# Patient Record
Sex: Female | Born: 1950 | Race: White | Hispanic: No | Marital: Single | Smoking: Never smoker
Health system: Southern US, Community
[De-identification: ages and names within clinical notes are randomized; demographics above are authoritative.]

## PROBLEM LIST (undated history)

## (undated) DIAGNOSIS — E039 Hypothyroidism, unspecified: Secondary | ICD-10-CM

## (undated) DIAGNOSIS — J45909 Unspecified asthma, uncomplicated: Secondary | ICD-10-CM

## (undated) DIAGNOSIS — I10 Essential (primary) hypertension: Secondary | ICD-10-CM

## (undated) DIAGNOSIS — J309 Allergic rhinitis, unspecified: Secondary | ICD-10-CM

## (undated) HISTORY — PX: SINOSCOPY: SHX187

## (undated) HISTORY — DX: Essential (primary) hypertension: I10

## (undated) HISTORY — PX: ADENOIDECTOMY: SUR15

## (undated) HISTORY — PX: CHOLECYSTECTOMY: SHX55

## (undated) HISTORY — DX: Hypothyroidism, unspecified: E03.9

## (undated) HISTORY — PX: TONSILLECTOMY: SUR1361

## (undated) HISTORY — PX: BASAL CELL CARCINOMA EXCISION: SHX1214

## (undated) HISTORY — DX: Allergic rhinitis, unspecified: J30.9

## (undated) HISTORY — DX: Unspecified asthma, uncomplicated: J45.909

---

## 2010-06-04 ENCOUNTER — Ambulatory Visit (HOSPITAL_COMMUNITY)
Admission: RE | Admit: 2010-06-04 | Discharge: 2010-06-04 | Payer: Self-pay | Source: Home / Self Care | Attending: Sports Medicine | Admitting: Sports Medicine

## 2010-09-05 ENCOUNTER — Other Ambulatory Visit: Payer: Self-pay | Admitting: Orthopedic Surgery

## 2010-09-05 DIAGNOSIS — M25572 Pain in left ankle and joints of left foot: Secondary | ICD-10-CM

## 2010-09-06 ENCOUNTER — Ambulatory Visit
Admission: RE | Admit: 2010-09-06 | Discharge: 2010-09-06 | Disposition: A | Discharge status: Home / Self Care | Payer: Self-pay | Source: Ambulatory Visit | Attending: Orthopedic Surgery | Admitting: Orthopedic Surgery

## 2010-09-06 DIAGNOSIS — M25572 Pain in left ankle and joints of left foot: Secondary | ICD-10-CM

## 2012-10-15 DIAGNOSIS — I152 Hypertension secondary to endocrine disorders: Secondary | ICD-10-CM | POA: Insufficient documentation

## 2012-10-15 DIAGNOSIS — E039 Hypothyroidism, unspecified: Secondary | ICD-10-CM | POA: Insufficient documentation

## 2012-10-15 DIAGNOSIS — E1159 Type 2 diabetes mellitus with other circulatory complications: Secondary | ICD-10-CM | POA: Insufficient documentation

## 2013-07-14 DIAGNOSIS — F411 Generalized anxiety disorder: Secondary | ICD-10-CM | POA: Insufficient documentation

## 2013-07-14 DIAGNOSIS — G43909 Migraine, unspecified, not intractable, without status migrainosus: Secondary | ICD-10-CM | POA: Insufficient documentation

## 2013-07-14 DIAGNOSIS — F329 Major depressive disorder, single episode, unspecified: Secondary | ICD-10-CM | POA: Insufficient documentation

## 2013-07-14 DIAGNOSIS — F32A Depression, unspecified: Secondary | ICD-10-CM | POA: Insufficient documentation

## 2013-07-14 DIAGNOSIS — J4520 Mild intermittent asthma, uncomplicated: Secondary | ICD-10-CM | POA: Insufficient documentation

## 2013-07-14 DIAGNOSIS — G250 Essential tremor: Secondary | ICD-10-CM | POA: Insufficient documentation

## 2013-07-14 DIAGNOSIS — R519 Headache, unspecified: Secondary | ICD-10-CM | POA: Insufficient documentation

## 2013-07-14 DIAGNOSIS — R51 Headache: Secondary | ICD-10-CM

## 2013-09-26 DIAGNOSIS — R5382 Chronic fatigue, unspecified: Secondary | ICD-10-CM

## 2013-09-26 DIAGNOSIS — R5381 Other malaise: Secondary | ICD-10-CM | POA: Insufficient documentation

## 2014-02-19 DIAGNOSIS — E559 Vitamin D deficiency, unspecified: Secondary | ICD-10-CM | POA: Insufficient documentation

## 2014-04-24 DIAGNOSIS — M5432 Sciatica, left side: Secondary | ICD-10-CM | POA: Insufficient documentation

## 2014-06-08 DIAGNOSIS — M5136 Other intervertebral disc degeneration, lumbar region: Secondary | ICD-10-CM | POA: Insufficient documentation

## 2014-06-08 DIAGNOSIS — M4310 Spondylolisthesis, site unspecified: Secondary | ICD-10-CM | POA: Insufficient documentation

## 2014-09-26 DIAGNOSIS — K2960 Other gastritis without bleeding: Secondary | ICD-10-CM | POA: Insufficient documentation

## 2015-06-27 DIAGNOSIS — J3089 Other allergic rhinitis: Secondary | ICD-10-CM | POA: Insufficient documentation

## 2016-03-12 DIAGNOSIS — K760 Fatty (change of) liver, not elsewhere classified: Secondary | ICD-10-CM | POA: Insufficient documentation

## 2016-03-12 DIAGNOSIS — K219 Gastro-esophageal reflux disease without esophagitis: Secondary | ICD-10-CM | POA: Insufficient documentation

## 2016-03-12 DIAGNOSIS — Z1211 Encounter for screening for malignant neoplasm of colon: Secondary | ICD-10-CM | POA: Insufficient documentation

## 2016-03-12 DIAGNOSIS — R945 Abnormal results of liver function studies: Secondary | ICD-10-CM | POA: Insufficient documentation

## 2016-03-12 DIAGNOSIS — R110 Nausea: Secondary | ICD-10-CM | POA: Insufficient documentation

## 2016-03-12 DIAGNOSIS — R7989 Other specified abnormal findings of blood chemistry: Secondary | ICD-10-CM | POA: Insufficient documentation

## 2016-10-26 DIAGNOSIS — R7301 Impaired fasting glucose: Secondary | ICD-10-CM | POA: Insufficient documentation

## 2016-10-26 DIAGNOSIS — E119 Type 2 diabetes mellitus without complications: Secondary | ICD-10-CM | POA: Insufficient documentation

## 2016-11-19 ENCOUNTER — Ambulatory Visit (INDEPENDENT_AMBULATORY_CARE_PROVIDER_SITE_OTHER): Payer: Medicare HMO | Admitting: Pediatrics

## 2016-11-19 ENCOUNTER — Encounter: Payer: Self-pay | Admitting: Pediatrics

## 2016-11-19 VITALS — BP 110/76 | HR 82 | Temp 98.2°F | Resp 16 | Ht 64.57 in | Wt 177.0 lb

## 2016-11-19 DIAGNOSIS — K219 Gastro-esophageal reflux disease without esophagitis: Secondary | ICD-10-CM | POA: Diagnosis not present

## 2016-11-19 DIAGNOSIS — J0100 Acute maxillary sinusitis, unspecified: Secondary | ICD-10-CM | POA: Diagnosis not present

## 2016-11-19 DIAGNOSIS — I10 Essential (primary) hypertension: Secondary | ICD-10-CM

## 2016-11-19 DIAGNOSIS — J4540 Moderate persistent asthma, uncomplicated: Secondary | ICD-10-CM | POA: Insufficient documentation

## 2016-11-19 DIAGNOSIS — J4530 Mild persistent asthma, uncomplicated: Secondary | ICD-10-CM

## 2016-11-19 DIAGNOSIS — J3089 Other allergic rhinitis: Secondary | ICD-10-CM | POA: Diagnosis not present

## 2016-11-19 DIAGNOSIS — J019 Acute sinusitis, unspecified: Secondary | ICD-10-CM | POA: Insufficient documentation

## 2016-11-19 MED ORDER — ALBUTEROL SULFATE HFA 108 (90 BASE) MCG/ACT IN AERS: 2.0000 | INHALATION_SPRAY | RESPIRATORY_TRACT | 3 refills | Status: DC | PRN

## 2016-11-19 MED ORDER — BUDESONIDE-FORMOTEROL FUMARATE 160-4.5 MCG/ACT IN AERO
INHALATION_SPRAY | RESPIRATORY_TRACT | 5 refills | Status: DC
Start: 2016-11-19 — End: 2017-02-23

## 2016-11-19 MED ORDER — FLUTICASONE PROPIONATE 50 MCG/ACT NA SUSP: NASAL | 5 refills | Status: DC

## 2016-11-19 MED ORDER — AMOXICILLIN-POT CLAVULANATE 875-125 MG PO TABS: ORAL_TABLET | ORAL | 0 refills | Status: DC

## 2016-11-19 NOTE — Patient Instructions (Signed)
Environmental control of dust and mold Zyrtec 10 mg once a day for runny nose Nasal saline irrigations at night followed by fluticasone 2 sprays per nostril at night Symbicort 160-2 puffs every 12 hours for cough or wheeze Pro-air 2 puffs every 4 hours if needed for wheezing or coughing spells. Should be less expensive than Xopenex Augmentin 875 mg every 12 hours for 10 days for infection Add prednisone 10 mg twice a day for 4 days, 10 mg on the fifth day Call me if you're not doing better on this treatment plan Continue on your other medications

## 2016-11-19 NOTE — Progress Notes (Signed)
[address] [place] [address] Dept: [phone]  New Patient Note  Patient ID: Annette Wade, female    DOB: [birth year]  Age: 66 y.o. MRN: [id number] Date of Office Visit: 11/19/2016 Referring provider: No referring provider defined for this encounter.    Chief Complaint: Cough (sx began October 24, 2016); Wheezing; and Nasal Congestion  HPI [place] Klemann presents for evaluation of coughing and wheezing since June 16. She went to an urgent care center and a chest x-ray was normal. She was given Zithromax. She has continued to have nasal congestion and a discolored postnasal drainage. She is bringing up a greenish mucus from her chest.. She has run out of Qvar. High doses of prednisone give her mood swings. She has had asthma for over 20 years. She has been using Xopenex but she has tolerated albuterol in the past. She has never had eczema or chronic hives. She has had one episode of pneumonia in the past She has had nasal congestion aggravated by exposure to dust, cigarette smoke and cats. She has gastroesophageal reflux and uses ranitidine 300 mg if needed.  Review of Systems  Constitutional: Negative.   HENT:       Nasal congestion and postnasal drainage for several years. History of sinus surgery-no polyps noted. History of tonsillectomy  Eyes:       One small cataract  Respiratory:       Asthma for over 20 years. History of 1 pneumonia  Cardiovascular:       Hypertension  Gastrointestinal:       Heartburn at times. History of cholecystectomy  Genitourinary: Negative.   Musculoskeletal: Negative.   Skin: Negative.   Neurological: Negative.   Endo/Heme/Allergies:       Hypothyroidism. No diabetes  Psychiatric/Behavioral:       Depression    Outpatient Encounter Prescriptions as of 11/19/2016  Medication Sig  . beclomethasone (QVAR) 80 MCG/ACT inhaler 2 puffs daily.  . Calcium Carbonate-Vit D-Min (CALCIUM 1200 PO) Take by mouth.  . clonazePAM (KLONOPIN) 1 MG  tablet Take 1 mg by mouth.  . DULoxetine (CYMBALTA) 60 MG capsule Take 60 mg by mouth.  . fluticasone (FLONASE) 50 MCG/ACT nasal spray Two sprays each nostril at night for nasal congestion or drainage.  . levalbuterol (XOPENEX HFA) 45 MCG/ACT inhaler Inhale into the lungs.  Marland Kitchen levothyroxine (SYNTHROID, LEVOTHROID) 75 MCG tablet Take one tablet by mouth daily on an empty stomach  . ranitidine (ZANTAC) 300 MG tablet   . traZODone (DESYREL) 50 MG tablet Take 100 mg by mouth.  . valsartan (DIOVAN) 160 MG tablet Take 160 mg by mouth.  . [DISCONTINUED] fluticasone (FLONASE) 50 MCG/ACT nasal spray 1 spray by Each Nare route daily.  Marland Kitchen albuterol (PROAIR HFA) 108 (90 Base) MCG/ACT inhaler Inhale 2 puffs into the lungs every 4 (four) hours as needed for wheezing or shortness of breath.  Marland Kitchen amoxicillin-clavulanate (AUGMENTIN) 875-125 MG tablet One tablet every 12 hours for 10 days for infection.  . budesonide-formoterol (SYMBICORT) 160-4.5 MCG/ACT inhaler Two puffs every 12 hours to prevent cough or wheeze. Rinse, gargle and spit after use.   No facility-administered encounter medications on file as of 11/19/2016.      Drug Allergies:  Allergies  Allergen Reactions  . Lisinopril Nausea Only and Other (See Comments)    Typical ACE Inhibitor Cough cough  . Moxifloxacin Nausea Only and Hives    Other reaction(s): HIVES  . Codeine Nausea And Vomiting  . Oxycodone-Acetaminophen Itching  . Prednisone Anxiety  Family History: Juda's family history includes Asthma in her daughter..Sinus problems in her daughter. Family history is negative for hay fever, eczema, hives, food allergies, chronic bronchitis or emphysema.  Social and environmental. There is a dog in the home. She is not exposed to cigarette smoking. She has never smoked cigarettes in the past. She is retired and unemployed  Physical Exam: BP 110/76 (BP Location: Left Arm, Patient Position: Sitting, Cuff Size: Normal)   Pulse 82   Temp  98.2 F (36.8 C) (Oral)   Resp 16   Ht 5' 4.57" (1.64 m)   Wt 177 lb (80.3 kg)   SpO2 97%   BMI 29.85 kg/m    Physical Exam  Constitutional: She is oriented to person, place, and time. She appears well-developed and well-nourished.  HENT:  Eyes normal. Ears normal. Nose mild swelling of nasal  turbinates. Pharynx normal except for yellow-green postnasal drainage  Neck: Neck supple. No thyromegaly present.  Cardiovascular:  S1 and S2 normal no murmurs  Pulmonary/Chest:  Clear to percussion and auscultation  Abdominal: Soft. There is no tenderness (no hepatosplenomegaly).  Lymphadenopathy:    She has no cervical adenopathy.  Neurological: She is alert and oriented to person, place, and time.  Psychiatric: She has a normal mood and affect. Her behavior is normal. Judgment and thought content normal.  Vitals reviewed.   Diagnostics: FVC 2.48 L FEV1 2.13 L. Predicted FVC 3.18 L predicted FEV1 2.42 L. After albuterol 2 puffs FVC 2.32 L FEV1 2.19 L-the spirometry shows a mild reduction in the forced vital capacity  Allergy skin tests show some reactivity to molds on intradermal testing only   Assessment  Assessment and Plan: 1. Mild persistent asthma without complication   2. Other allergic rhinitis   3. Gastroesophageal reflux disease without esophagitis   4. Acute non-recurrent maxillary sinusitis   5. Essential hypertension     Meds ordered this encounter  Medications  . fluticasone (FLONASE) 50 MCG/ACT nasal spray    Sig: Two sprays each nostril at night for nasal congestion or drainage.    Dispense:  16 g    Refill:  5    Hold rx.  Patient will call for fill.  . budesonide-formoterol (SYMBICORT) 160-4.5 MCG/ACT inhaler    Sig: Two puffs every 12 hours to prevent cough or wheeze. Rinse, gargle and spit after use.    Dispense:  1 Inhaler    Refill:  5    Hold rx.  Patient will call for fill.  Marland Kitchen albuterol (PROAIR HFA) 108 (90 Base) MCG/ACT inhaler    Sig: Inhale 2  puffs into the lungs every 4 (four) hours as needed for wheezing or shortness of breath.    Dispense:  1 Inhaler    Refill:  3    Hold rx.  Patient will call for fill.  Marland Kitchen amoxicillin-clavulanate (AUGMENTIN) 875-125 MG tablet    Sig: One tablet every 12 hours for 10 days for infection.    Dispense:  20 tablet    Refill:  0    Patient Instructions  Environmental control of dust and mold Zyrtec 10 mg once a day for runny nose Nasal saline irrigations at night followed by fluticasone 2 sprays per nostril at night Symbicort 160-2 puffs every 12 hours for cough or wheeze Pro-air 2 puffs every 4 hours if needed for wheezing or coughing spells. Should be less expensive than Xopenex Augmentin 875 mg every 12 hours for 10 days for infection Add prednisone 10 mg twice  a day for 4 days, 10 mg on the fifth day Call me if you're not doing better on this treatment plan Continue on your other medications   Return in about 6 weeks (around 12/31/2016).   Thank you for the opportunity to care for this patient.  Please do not hesitate to contact me with questions.  Tonette BihariJ. A. Itzayana Pardy, M.D.  [facility] [address] [place], [place] [address] [phone]

## 2017-01-05 ENCOUNTER — Ambulatory Visit: Payer: Medicare HMO | Admitting: Pediatrics

## 2017-02-09 DIAGNOSIS — H5213 Myopia, bilateral: Secondary | ICD-10-CM | POA: Insufficient documentation

## 2017-02-09 DIAGNOSIS — H524 Presbyopia: Secondary | ICD-10-CM

## 2017-02-15 DIAGNOSIS — H2513 Age-related nuclear cataract, bilateral: Secondary | ICD-10-CM | POA: Insufficient documentation

## 2017-02-15 DIAGNOSIS — H25013 Cortical age-related cataract, bilateral: Secondary | ICD-10-CM | POA: Insufficient documentation

## 2017-02-16 DIAGNOSIS — H2512 Age-related nuclear cataract, left eye: Secondary | ICD-10-CM | POA: Insufficient documentation

## 2017-02-23 ENCOUNTER — Ambulatory Visit (INDEPENDENT_AMBULATORY_CARE_PROVIDER_SITE_OTHER): Payer: Medicare HMO | Admitting: Pediatrics

## 2017-02-23 ENCOUNTER — Encounter: Payer: Self-pay | Admitting: Pediatrics

## 2017-02-23 VITALS — BP 118/72 | HR 74 | Temp 98.1°F | Resp 16

## 2017-02-23 DIAGNOSIS — I10 Essential (primary) hypertension: Secondary | ICD-10-CM | POA: Diagnosis not present

## 2017-02-23 DIAGNOSIS — J4541 Moderate persistent asthma with (acute) exacerbation: Secondary | ICD-10-CM | POA: Diagnosis not present

## 2017-02-23 DIAGNOSIS — J069 Acute upper respiratory infection, unspecified: Secondary | ICD-10-CM | POA: Diagnosis not present

## 2017-02-23 DIAGNOSIS — K219 Gastro-esophageal reflux disease without esophagitis: Secondary | ICD-10-CM

## 2017-02-23 MED ORDER — ALBUTEROL SULFATE HFA 108 (90 BASE) MCG/ACT IN AERS: 2.0000 | INHALATION_SPRAY | RESPIRATORY_TRACT | 3 refills | Status: DC | PRN

## 2017-02-23 MED ORDER — BUDESONIDE-FORMOTEROL FUMARATE 160-4.5 MCG/ACT IN AERO: INHALATION_SPRAY | RESPIRATORY_TRACT | 5 refills | Status: DC

## 2017-02-23 MED ORDER — AMOXICILLIN-POT CLAVULANATE 875-125 MG PO TABS: ORAL_TABLET | ORAL | 0 refills | Status: DC

## 2017-02-23 NOTE — Progress Notes (Signed)
[address] [place] [address] Dept: [phone]  FOLLOW UP NOTE  Patient ID: Annette Wade, female    DOB: 1950/09/11  Age: 66 y.o. MRN: [id number] Date of Office Visit: 02/23/2017  Assessment  Chief Complaint: Cough (sneezing plus chest tightness.  sx x 3  days)  HPI Annette Wade presents for evaluation of a cough for 3 days. She has been at [employer] and one of the employees has had a cold. Her asthma had been well controlled with the use of Symbicort 160-2 puffs twice a day. She has developed a stuffy nose and some sinus pressure. She does not have a fever  Current medications are outlined in the chart   Drug Allergies:  Allergies  Allergen Reactions  . Lisinopril Nausea Only and Other (See Comments)    Typical ACE Inhibitor Cough cough  . Moxifloxacin Nausea Only and Hives    Other reaction(s): HIVES  . Codeine Nausea And Vomiting  . Oxycodone-Acetaminophen Itching  . Prednisone Anxiety    Physical Exam: BP 118/72 (BP Location: Left Arm, Patient Position: Sitting, Cuff Size: Normal)   Pulse 74   Temp 98.1 F (36.7 C) (Oral)   Resp 16   SpO2 95%    Physical Exam  Constitutional: She is oriented to person, place, and time. She appears well-developed and well-nourished.  HENT:  Eyes normal. Ears normal. Nose mild swelling of nasal  turbinates. Pharynx normal.  Neck: Neck supple.  Cardiovascular:  S1 and S2 normal no murmurs  Pulmonary/Chest:  Clear to percussion and auscultation  Lymphadenopathy:    She has no cervical adenopathy.  Neurological: She is alert and oriented to person, place, and time.  Psychiatric: She has a normal mood and affect. Her behavior is normal. Judgment and thought content normal.  Vitals reviewed.   Diagnostics:  FVC 3.21 L FEV1 2.54 L. Predicted FVC 3.18 L predicted FEV1 2.42 L-the spirometry is in the normal range  Assessment and Plan: 1. Moderate persistent asthma with acute exacerbation   2. Viral  upper respiratory tract infection   3. Gastroesophageal reflux disease without esophagitis   4. Essential hypertension     Meds ordered this encounter  Medications  . budesonide-formoterol (SYMBICORT) 160-4.5 MCG/ACT inhaler    Sig: Two puffs every 12 hours to prevent cough or wheeze. Rinse, gargle and spit after use.    Dispense:  1 Inhaler    Refill:  5  . amoxicillin-clavulanate (AUGMENTIN) 875-125 MG tablet    Sig: One tablet every 12 hours for 10 days for infection.    Dispense:  20 tablet    Refill:  0    Hold rx.  Patient will call for fill.  Marland Kitchen albuterol (PROAIR HFA) 108 (90 Base) MCG/ACT inhaler    Sig: Inhale 2 puffs into the lungs every 4 (four) hours as needed for wheezing or shortness of breath.    Dispense:  1 Inhaler    Refill:  3    Patient Instructions  Zyrtec 10 mg once a day for runny nose Nasal saline irrigations at night followed by fluticasone 2 sprays per nostril at night Symbicort 160-2 puffs every 12 hours for coughing or wheezing Pro-air 2 puffs every 4 hours if needed for wheezing or coughing spells Add  prednisone 10 mg twice a day for 4 days, 10 mg on the fifth day If she develops a sinus infection , she will start on Augmentin 875 mg every 12 hours for 10 days  She should  have a flu vaccination in about 10 days Call us if she is not doing well on this treatment plan Continue on her other medications   Return in about 6 months (around 08/24/2017).    Thank you for the opportunity to care for this patient.  Please do not hesitate to contact me with questions.  Tonette Bihari, M.D.  [facility] [address] [place], [place] [address] [phone]

## 2017-02-23 NOTE — Patient Instructions (Addendum)
Zyrtec 10 mg once a day for runny nose Nasal saline irrigations at night followed by fluticasone 2 sprays per nostril at night Symbicort 160-2 puffs every 12 hours for coughing or wheezing Pro-air 2 puffs every 4 hours if needed for wheezing or coughing spells Add  prednisone 10 mg twice a day for 4 days, 10 mg on the fifth day If she develops a sinus infection , she will start on Augmentin 875 mg every 12 hours for 10 days  She should have a flu vaccination in about 10 days Call us if she is not doing well on this treatment plan Continue on her other medications

## 2017-06-02 ENCOUNTER — Encounter (HOSPITAL_BASED_OUTPATIENT_CLINIC_OR_DEPARTMENT_OTHER): Payer: Self-pay | Admitting: Emergency Medicine

## 2017-06-02 ENCOUNTER — Other Ambulatory Visit: Payer: Self-pay

## 2017-06-02 ENCOUNTER — Emergency Department (HOSPITAL_BASED_OUTPATIENT_CLINIC_OR_DEPARTMENT_OTHER): Payer: Medicare HMO

## 2017-06-02 ENCOUNTER — Telehealth: Payer: Self-pay | Admitting: *Deleted

## 2017-06-02 ENCOUNTER — Emergency Department (HOSPITAL_BASED_OUTPATIENT_CLINIC_OR_DEPARTMENT_OTHER)
Admission: EM | Admit: 2017-06-02 | Discharge: 2017-06-02 | Disposition: A | Discharge status: Home / Self Care | Payer: Medicare HMO | Attending: Emergency Medicine | Admitting: Emergency Medicine

## 2017-06-02 DIAGNOSIS — J209 Acute bronchitis, unspecified: Secondary | ICD-10-CM | POA: Insufficient documentation

## 2017-06-02 DIAGNOSIS — J45909 Unspecified asthma, uncomplicated: Secondary | ICD-10-CM | POA: Diagnosis not present

## 2017-06-02 DIAGNOSIS — J0100 Acute maxillary sinusitis, unspecified: Secondary | ICD-10-CM

## 2017-06-02 DIAGNOSIS — R05 Cough: Secondary | ICD-10-CM | POA: Insufficient documentation

## 2017-06-02 DIAGNOSIS — E039 Hypothyroidism, unspecified: Secondary | ICD-10-CM | POA: Insufficient documentation

## 2017-06-02 DIAGNOSIS — I10 Essential (primary) hypertension: Secondary | ICD-10-CM | POA: Diagnosis not present

## 2017-06-02 DIAGNOSIS — R0981 Nasal congestion: Secondary | ICD-10-CM | POA: Diagnosis not present

## 2017-06-02 DIAGNOSIS — J029 Acute pharyngitis, unspecified: Secondary | ICD-10-CM | POA: Diagnosis present

## 2017-06-02 LAB — RAPID STREP SCREEN (MED CTR MEBANE ONLY): STREPTOCOCCUS, GROUP A SCREEN (DIRECT): NEGATIVE

## 2017-06-02 MED ORDER — PREDNISONE 10 MG (21) PO TBPK: ORAL_TABLET | ORAL | 0 refills | Status: DC

## 2017-06-02 MED ORDER — AMOXICILLIN-POT CLAVULANATE 875-125 MG PO TABS: 1.0000 | ORAL_TABLET | Freq: Two times a day (BID) | ORAL | 0 refills | Status: DC

## 2017-06-02 MED FILL — predniSONE 10 MG TABS: 10 | 12 days supply | Qty: 42 | Fill #0

## 2017-06-02 MED FILL — AMOX-CLAV 875-125 MG TABLET: 875-125 | 7 days supply | Qty: 14 | Fill #0

## 2017-06-02 NOTE — ED Triage Notes (Signed)
Nasal congestion, facial pressure, sore throat, cough and chills since Sunday.

## 2017-06-02 NOTE — Telephone Encounter (Signed)
Pt called having sore throat, chest tightness, coughing and chills since Sunday. No fever or body aches. Did explain we can not see pt with chills, body aches, or fever. Also, we can not swab for flu or strep. We did not have any openings today. Advised her to go to an urgent care. Patient agreed.

## 2017-06-02 NOTE — ED Provider Notes (Signed)
[facility] EMERGENCY DEPARTMENT Provider Note   CSN: [id number] Arrival date & time: 06/02/17  1032     History   Chief Complaint Chief Complaint  Patient presents with  . Cough  . Sinus Problem    HPI Annette Wade is a 67 y.o. female.  Pt presents to the ED today with cough and sinus congestion.  The pt said she developed nasal congestion, sore throat, cough on Sunday the 20th.  It became worse on the 21st.  No fever.  She has been taking her symbicort and flonase without improvement in sx.        Past Medical History:  Diagnosis Date  . Allergic rhinitis   . Asthma   . Hypertension   . Hypothyroid     Patient Active Problem List   Diagnosis Date Noted  . Moderate persistent asthma with acute exacerbation 02/23/2017  . Viral upper respiratory tract infection 02/23/2017  . Nuclear sclerotic cataract of left eye 02/16/2017  . Cortical age-related cataract of both eyes 02/15/2017  . Nuclear sclerotic cataract of both eyes 02/15/2017  . Myopia with presbyopia, bilateral 02/09/2017  . Mild persistent asthma without complication 11/19/2016  . Acute non-recurrent maxillary sinusitis 11/19/2016  . IFG (impaired fasting glucose) 10/26/2016  . Abnormal liver function test 03/12/2016  . Colon cancer screening 03/12/2016  . Fatty liver 03/12/2016  . Gastroesophageal reflux disease without esophagitis 03/12/2016  . Nausea 03/12/2016  . Other allergic rhinitis 06/27/2015  . Erosive gastritis 09/26/2014  . Degenerative spondylolisthesis 06/08/2014  . Lumbar degenerative disc disease 06/08/2014  . Sciatica of left side 04/24/2014  . Vitamin D deficiency 02/19/2014  . Chronic fatigue and malaise 09/26/2013  . Anxiety state 07/14/2013  . Depression 07/14/2013  . Episodic headache 07/14/2013  . Essential tremor 07/14/2013  . Migraine 07/14/2013  . Mild intermittent asthma 07/14/2013  . Essential hypertension 10/15/2012  . Hypothyroidism, adult 10/15/2012     Past Surgical History:  Procedure Laterality Date  . CESAREAN SECTION    . CHOLECYSTECTOMY    . SINOSCOPY    . TONSILLECTOMY      OB History    No data available       Home Medications    Prior to Admission medications   Medication Sig Start Date End Date Taking? Authorizing Provider  albuterol (PROAIR HFA) 108 (90 Base) MCG/ACT inhaler Inhale 2 puffs into the lungs every 4 (four) hours as needed for wheezing or shortness of breath. 02/23/17   Fletcher Anon, MD  amoxicillin-clavulanate (AUGMENTIN) 875-125 MG tablet Take 1 tablet by mouth every 12 (twelve) hours. 06/02/17   Jacalyn Lefevre, MD  budesonide-formoterol [facility]) 160-4.5 MCG/ACT inhaler Two puffs every 12 hours to prevent cough or wheeze. Rinse, gargle and spit after use. 02/23/17   Fletcher Anon, MD  Calcium Carbonate-Vit D-Min (CALCIUM 1200 PO) Take by mouth.    [provider]  clonazePAM (KLONOPIN) 1 MG tablet Take 1 mg by mouth.    [provider]  DULoxetine (CYMBALTA) 60 MG capsule Take 60 mg by mouth.    [provider]  fluticasone (FLONASE) 50 MCG/ACT nasal spray Two sprays each nostril at night for nasal congestion or drainage. 11/19/16   Fletcher Anon, MD  levothyroxine (SYNTHROID, LEVOTHROID) 75 MCG tablet Take one tablet by mouth daily on an empty stomach 04/29/15   [provider]  losartan (COZAAR) 50 MG tablet Take 50 mg by mouth. 12/18/16 12/18/17  [provider]  predniSONE Albesa Seen Carlus Pavlov  21 TAB) 10 MG (21) TBPK tablet Take 6 tabs by mouth daily  for 2 days, then 5 tabs for 2 days, then 4 tabs for 2 days, then 3 tabs for 2 days, 2 tabs for 2 days, then 1 tab by mouth daily for 2 days 06/02/17   Jacalyn LefevreHaviland, Vernita Tague, MD  ranitidine (ZANTAC) 300 MG tablet  08/05/15   [provider]  traZODone (DESYREL) 50 MG tablet Take 100 mg by mouth.    [provider]    Family History Family History  Problem Relation Age of Onset  . Asthma  Daughter   . Allergic rhinitis Neg Hx   . Angioedema Neg Hx   . Eczema Neg Hx   . Immunodeficiency Neg Hx   . Urticaria Neg Hx     Social History Social History   Tobacco Use  . Smoking status: Never Smoker  . Smokeless tobacco: Never Used  Substance Use Topics  . Alcohol use: No  . Drug use: No     Allergies   Lisinopril; Moxifloxacin; Codeine; Oxycodone-acetaminophen; and Prednisone   Review of Systems Review of Systems  HENT: Positive for congestion and sore throat.   Respiratory: Positive for cough.   All other systems reviewed and are negative.    Physical Exam Updated Vital Signs BP (!) 146/93   Pulse (!) 115   Temp 99 F (37.2 C)   Resp 16   Ht 5\' 5"  (1.651 m)   Wt 77.1 kg (170 lb)   SpO2 100%   BMI 28.29 kg/m   Physical Exam  Constitutional: She is oriented to person, place, and time. She appears well-developed and well-nourished.  HENT:  Head: Normocephalic and atraumatic.  Right Ear: External ear normal.  Left Ear: External ear normal.  Nose: Nose normal.  Mouth/Throat: Posterior oropharyngeal erythema present.  Tender maxillary sinuses  Eyes: Conjunctivae and EOM are normal. Pupils are equal, round, and reactive to light.  Neck: Normal range of motion. Neck supple.  Cardiovascular: Normal rate, regular rhythm, normal heart sounds and intact distal pulses.  Pulmonary/Chest: Effort normal and breath sounds normal.  Abdominal: Soft. Bowel sounds are normal.  Musculoskeletal: Normal range of motion.  Neurological: She is alert and oriented to person, place, and time.  Skin: Skin is warm. Capillary refill takes less than 2 seconds.  Psychiatric: She has a normal mood and affect. Her behavior is normal. Judgment and thought content normal.  Nursing note and vitals reviewed.    ED Treatments / Results  Labs (all labs ordered are listed, but only abnormal results are displayed) Labs Reviewed  RAPID STREP SCREEN (NOT AT [facility])  CULTURE, GROUP A  STREP [facility])    EKG  EKG Interpretation None       Radiology Dg Chest 2 View  Result Date: 06/02/2017 CLINICAL DATA:  Nasal congestion, facial pressure EXAM: CHEST  2 VIEW COMPARISON:  10/26/2016 FINDINGS: The heart size and mediastinal contours are within normal limits. Both lungs are clear. The visualized skeletal structures are unremarkable. IMPRESSION: No active cardiopulmonary disease. Electronically Signed   By: Elige KoHetal  Patel   On: 06/02/2017 11:07    Procedures Procedures (including critical care time)  Medications Ordered in ED Medications - No data to display   Initial Impression / Assessment and Plan / ED Course  I have reviewed the triage vital signs and the nursing notes.  Pertinent labs & imaging results that were available during my care of the patient were reviewed by me and considered  in my medical decision making (see chart for details).    Pt said she usually gets augmentin and prednisone which helps sx.  She is instructed to return if worse.  F/u with pcp.  Final Clinical Impressions(s) / ED Diagnoses   Final diagnoses:  Acute bronchitis, unspecified organism  Acute non-recurrent maxillary sinusitis    ED Discharge Orders        Ordered    amoxicillin-clavulanate (AUGMENTIN) 875-125 MG tablet  Every 12 hours     06/02/17 1207    predniSONE (STERAPRED UNI-PAK 21 TAB) 10 MG (21) TBPK tablet     06/02/17 1207       Jacalyn Lefevre, MD 06/02/17 1208

## 2017-06-04 LAB — CULTURE, GROUP A STREP (THRC)

## 2017-08-04 DIAGNOSIS — E785 Hyperlipidemia, unspecified: Secondary | ICD-10-CM

## 2017-08-04 DIAGNOSIS — E1169 Type 2 diabetes mellitus with other specified complication: Secondary | ICD-10-CM | POA: Insufficient documentation

## 2017-08-06 DIAGNOSIS — N289 Disorder of kidney and ureter, unspecified: Secondary | ICD-10-CM | POA: Insufficient documentation

## 2017-08-13 DIAGNOSIS — D892 Hypergammaglobulinemia, unspecified: Secondary | ICD-10-CM | POA: Insufficient documentation

## 2017-08-24 ENCOUNTER — Encounter: Payer: Self-pay | Admitting: Family Medicine

## 2017-08-24 ENCOUNTER — Ambulatory Visit (INDEPENDENT_AMBULATORY_CARE_PROVIDER_SITE_OTHER): Payer: Medicare HMO | Admitting: Family Medicine

## 2017-08-24 VITALS — BP 108/64 | HR 80 | Temp 98.0°F | Resp 20 | Ht 64.4 in | Wt 174.2 lb

## 2017-08-24 DIAGNOSIS — J3089 Other allergic rhinitis: Secondary | ICD-10-CM | POA: Diagnosis not present

## 2017-08-24 DIAGNOSIS — K219 Gastro-esophageal reflux disease without esophagitis: Secondary | ICD-10-CM

## 2017-08-24 DIAGNOSIS — J4541 Moderate persistent asthma with (acute) exacerbation: Secondary | ICD-10-CM | POA: Diagnosis not present

## 2017-08-24 MED ORDER — MONTELUKAST SODIUM 10 MG PO TABS: 10.0000 mg | ORAL_TABLET | Freq: Every day | ORAL | 5 refills | Status: DC

## 2017-08-24 NOTE — Progress Notes (Signed)
[address] [place] [address] Dept: [phone]  FOLLOW UP NOTE  Patient ID: Annette Wade, female    DOB: [birth year]  Age: 67 y.o. MRN: [id number] Date of Office Visit: 08/24/2017  Assessment  Chief Complaint: Asthma; Cough; and Allergic Rhinitis   HPI Annette Wade is a 67 year old female who presents to the clinic for a follow up visit. She was last seen in this clinic on 02/23/2017 by Dr. Beaulah Dinning for evaluation of asthma with cough.   In the interim, she visited the Emergency Department on 06/02/2017 with acute bronchitis requiring an antibiotic and a prednisone taper for resolution of symptoms.  At today's visit, she reports her asthma as well controlled with no shortness of breath or wheeze with rest or activity. She reports a dry intermittent cough for the last 2 weeks for which she has been using her Ventolin inhaler twice a day with short term resolution of symptoms. She is using Symbicort 160- 2 puffs twice a day and reports she has been out of montelukast 10 mg for 3 weeks or more.   Allergic rhinitis is reported as not well controlled with symptoms including nasal congestion, runny nose, thick post nasal drainage, and frequent throat clearing with voice hoarseness. She reports sinus pressure under both eyes for about 2 weeks. She is using Flonase nasal spray daily and cetirizine 10 mg once a day. She reports red, itchy, watery eyes for which she is using an over the counter antihistamine eye drop with relief.  Gastroesophageal reflux is reported as not well controlled with heartburn occurring 3-4 times a week. She takes ranitidine as needed with moderate relief. She states that this is working for her and she would like to continue this regimen.   Her current medications are listed in the chart.  Drug Allergies:  Allergies  Allergen Reactions  . Lisinopril Nausea Only and Other (See Comments)    Typical ACE Inhibitor Cough cough  . Moxifloxacin Nausea Only  and Hives    Other reaction(s): HIVES  . Codeine Nausea And Vomiting  . Oxycodone-Acetaminophen Itching  . Prednisone Anxiety    Physical Exam: BP 108/64 (BP Location: Left Arm, Patient Position: Sitting, Cuff Size: Normal)   Pulse 80   Temp 98 F (36.7 C) (Oral)   Resp 20   Ht 5' 4.4" (1.636 m)   Wt 174 lb 3.2 oz (79 kg)   SpO2 96%   BMI 29.53 kg/m    Physical Exam  Constitutional: She is oriented to person, place, and time. She appears well-developed and well-nourished.  Bilateral nares slightly erythematous and edematous with clear nasal drainage noted. Pharynx slightly erythematous with no exudate noted. Maxillary sinus painful to palpation.  HENT:  Head: Normocephalic.  Right Ear: External ear normal.  Left Ear: External ear normal.  Eyes: Conjunctivae are normal.  Neck: Normal range of motion. Neck supple.  Cardiovascular: Normal rate, regular rhythm and normal heart sounds.  No murmur noted.   Pulmonary/Chest: Effort normal and breath sounds normal.  Lungs clear to auscultation  Musculoskeletal: Normal range of motion.  Neurological: She is alert and oriented to person, place, and time.  Skin: Skin is warm and dry.  Psychiatric: She has a normal mood and affect. Her behavior is normal. Judgment and thought content normal.    Diagnostics: FVC 3.12, FEV1 2.53. Predicted FVC 3.09, predicted FEV1 2.35. Spirometry is within the normal range.   Assessment and Plan: 1. Moderate persistent asthma with acute exacerbation   2.  Gastroesophageal reflux disease without esophagitis   3. Other allergic rhinitis     Meds ordered this encounter  Medications  . montelukast (SINGULAIR) 10 MG tablet    Sig: Take 1 tablet (10 mg total) by mouth at bedtime.    Dispense:  30 tablet    Refill:  5    Patient Instructions  Zyrtec 10 mg once a day if needed for a runny nose Nasal saline irrigations at night followed by fluticasone 2 sprays per nostril at night Symbicort 160-2  puffs every 12 hours to prevent coughing or wheezing Montelukast 10 mg once a day to prevent cough or wheeze Ventolin 2 puffs every 4 hours if needed for wheezing or coughing spells Add  prednisone 10 mg twice a day for 4 days, 10 mg on the fifth day to break up congestion and cough  Call us if you are not doing well on this treatment plan  Continue on her other medications  Follow up in 1 year or sooner if needed   Return in about 1 year (around 08/25/2018), or if symptoms worsen or fail to improve.   Thank you for the opportunity to care for this patient.  Please do not hesitate to contact me with questions.  Annette LeylandAnne Ambs, FNP [facility]  I have provided oversight concerning Annette Leylandnne Wade' evaluation and treatment of this patient's health issues addressed during today's encounter. I agree with the assessment and therapeutic plan as outlined in the note.   Thank you for the opportunity to care for this patient.  Please do not hesitate to contact me with questions.  Tonette BihariJ. A. Mckinzey Wade, M.D.  [facility]  [address] [place], [place] [id number] [phone]

## 2017-08-24 NOTE — Patient Instructions (Addendum)
Zyrtec 10 mg once a day if needed for a runny nose Nasal saline irrigations at night followed by fluticasone 2 sprays per nostril at night Symbicort 160-2 puffs every 12 hours to prevent coughing or wheezing Montelukast 10 mg once a day to prevent cough or wheeze Ventolin 2 puffs every 4 hours if needed for wheezing or coughing spells Add  prednisone 10 mg twice a day for 4 days, 10 mg on the fifth day to break up congestion and cough  Call us if you are not doing well on this treatment plan  Continue on her other medications  Follow up in 1 year or sooner if needed

## 2018-07-29 ENCOUNTER — Other Ambulatory Visit: Payer: Self-pay | Admitting: Pediatrics

## 2018-08-01 ENCOUNTER — Telehealth: Payer: Self-pay | Admitting: Pediatrics

## 2018-08-01 NOTE — Telephone Encounter (Signed)
Informed pt that she can go to [email or web address] and fill out the form for the savings copayment card

## 2018-08-01 NOTE — Telephone Encounter (Signed)
PT called in to say the symbicort is costing $219.00 and she cannot afford it. Not sure why it cost so much. Looking for alternative at lower price. Please research and call PT back.

## 2018-08-03 ENCOUNTER — Ambulatory Visit: Payer: Medicare HMO | Admitting: Allergy and Immunology

## 2018-08-03 ENCOUNTER — Other Ambulatory Visit: Payer: Self-pay

## 2018-08-03 ENCOUNTER — Encounter: Payer: Self-pay | Admitting: Allergy and Immunology

## 2018-08-03 VITALS — BP 120/80 | HR 100 | Temp 97.9°F | Resp 18 | Ht 64.0 in | Wt 179.8 lb

## 2018-08-03 DIAGNOSIS — J0100 Acute maxillary sinusitis, unspecified: Secondary | ICD-10-CM | POA: Diagnosis not present

## 2018-08-03 DIAGNOSIS — R053 Chronic cough: Secondary | ICD-10-CM | POA: Insufficient documentation

## 2018-08-03 DIAGNOSIS — K219 Gastro-esophageal reflux disease without esophagitis: Secondary | ICD-10-CM

## 2018-08-03 DIAGNOSIS — R05 Cough: Secondary | ICD-10-CM | POA: Diagnosis not present

## 2018-08-03 DIAGNOSIS — J4540 Moderate persistent asthma, uncomplicated: Secondary | ICD-10-CM | POA: Diagnosis not present

## 2018-08-03 MED ORDER — AZELASTINE HCL 0.1 % NA SOLN: 1.0000 | Freq: Two times a day (BID) | NASAL | 5 refills | Status: DC | PRN

## 2018-08-03 MED ORDER — OMEPRAZOLE 20 MG PO CPDR: 20.0000 mg | DELAYED_RELEASE_CAPSULE | Freq: Every day | ORAL | 5 refills | Status: DC

## 2018-08-03 MED ORDER — ALBUTEROL SULFATE HFA 108 (90 BASE) MCG/ACT IN AERS: 2.0000 | INHALATION_SPRAY | RESPIRATORY_TRACT | 2 refills | Status: DC | PRN

## 2018-08-03 MED ORDER — AMOXICILLIN-POT CLAVULANATE 875-125 MG PO TABS: 1.0000 | ORAL_TABLET | Freq: Two times a day (BID) | ORAL | 0 refills | Status: AC

## 2018-08-03 MED ORDER — BUDESONIDE-FORMOTEROL FUMARATE 160-4.5 MCG/ACT IN AERO: 2.0000 | INHALATION_SPRAY | Freq: Two times a day (BID) | RESPIRATORY_TRACT | 5 refills | Status: DC

## 2018-08-03 NOTE — Assessment & Plan Note (Signed)
   Appropriate reflux lifestyle modifications have been provided.  Continue famotidine 20 mg twice daily.  A prescription has been provided for omeprazole 20 mg daily, 30 minutes prior to breakfast.

## 2018-08-03 NOTE — Progress Notes (Signed)
Follow-up Note  RE: [place] MRN: [id number] DOB: January 11, 1951 Date of Office Visit: 08/03/2018  Primary care provider: Bailey Mech, PA-C Referring provider: Bernerd Pho*  History of present illness: Annette Wade is a 68 y.o. female with moderate persistent asthma, gastroesophageal reflux, and allergic rhinitis presenting today for a sick visit.  She complains of sinus pressure, nasal congestion, thick postnasal drainage, and a persistent cough.  She denies fever and chills.  She describes the cough is originating from a tickle in the base of her throat which she believes is secondary to thick postnasal drainage.  She recently saw her primary care physician and was started on doxycycline, however experienced no perceived benefit and felt that this medication caused gastric discomfort.  She is attempting to control her reflux with famotidine 20 mg twice daily.  She reports that while taking the famotidine her reflux has improved but reports that her heartburn still "comes and goes."  She feels that her asthma has been well controlled with Symbicort 2 inhalations twice daily.  She ran out of this medication a couple days ago and requests a refill.  She admits that she has not been using a spacer device with her HFA inhalers.  Assessment and plan: Acute sinusitis  A prescription has been provided for Augmentin 875-125 mg twice daily x10 days.  Prednisone has been provided, 20 mg x 4 days, 10 mg x1 day, then stop.  A prescription has been provided for azelastine nasal spray, 1-2 sprays per nostril 2 times daily as needed.   May continue using fluticasone nasal spray as needed.  Nasal saline lavage (NeilMed) has been recommended as needed and prior to medicated nasal sprays along with instructions for proper administration.  For thick post nasal drainage, add guaifenesin [phone] mg (Mucinex)  twice daily as needed with adequate hydration as discussed.  Hold  cetirizine (Zyrtec) for now.  Gastroesophageal reflux disease without esophagitis  Appropriate reflux lifestyle modifications have been provided.  Continue famotidine 20 mg twice daily.  A prescription has been provided for omeprazole 20 mg daily, 30 minutes prior to breakfast.  Moderate persistent asthma  Restart Symbicort 160-4.5 micro grams, 2 inhalations twice daily.  Samples have been provided.  To maximize pulmonary deposition, a spacer has been provided along with instructions for its proper administration with an HFA inhaler.  Continue montelukast 10 mg daily at bedtime and albuterol every 4-6 hours as needed.  Subjective and objective measures of pulmonary function will be followed and the treatment plan will be adjusted accordingly.  Cough, persistent Most likely due to thick postnasal drainage and acid reflux.  Treatment plan as outlined above.   Meds ordered this encounter  Medications  . amoxicillin-clavulanate (AUGMENTIN) 875-125 MG tablet    Sig: Take 1 tablet by mouth 2 (two) times daily for 10 days.    Dispense:  20 tablet    Refill:  0  . azelastine (ASTELIN) 0.1 % nasal spray    Sig: Place 1-2 sprays into both nostrils 2 (two) times daily as needed.    Dispense:  30 mL    Refill:  5  . omeprazole (PRILOSEC) 20 MG capsule    Sig: Take 1 capsule (20 mg total) by mouth daily.    Dispense:  30 capsule    Refill:  5  . budesonide-formoterol (SYMBICORT) 160-4.5 MCG/ACT inhaler    Sig: Inhale 2 puffs into the lungs 2 (two) times daily.    Dispense:  1 Inhaler  Refill:  5    Last fill needs appt  . albuterol (PROVENTIL HFA;VENTOLIN HFA) 108 (90 Base) MCG/ACT inhaler    Sig: Inhale 2 puffs into the lungs every 4 (four) hours as needed for wheezing or shortness of breath.    Dispense:  1 Inhaler    Refill:  2    Diagnostics: Spirometry:  Normal with an FEV1 of 107% predicted.  Please see scanned spirometry results for details.    Physical examination:  Blood pressure 120/80, pulse 100, temperature 97.9 F (36.6 C), temperature source Oral, resp. rate 18, height  (1.626 m), weight 179 lb 12.8 oz (81.6 kg), SpO2 95 %.  General: Alert, interactive, in no acute distress. HEENT: TMs pearly gray, turbinates edematous with thick discharge, post-pharynx erythematous. Neck: Supple without lymphadenopathy. Lungs: Clear to auscultation without wheezing, rhonchi or rales. CV: Normal S1, S2 without murmurs. Skin: Warm and dry, without lesions or rashes.  The following portions of the patient's history were reviewed and updated as appropriate: allergies, current medications, past family history, past medical history, past social history, past surgical history and problem list.  Allergies as of 08/03/2018      Reactions   Lisinopril Nausea Only, Other (See Comments)   Typical ACE Inhibitor Cough cough   Moxifloxacin Nausea Only, Hives   Other reaction(s): HIVES   Doxycycline Nausea Only   Codeine Nausea And Vomiting   Oxycodone-acetaminophen Itching   Prednisone Anxiety      Medication List       Accurate as of August 03, 2018  8:36 PM. Always use your most recent med list.        albuterol 108 (90 Base) MCG/ACT inhaler Commonly known as:  ProAir HFA Inhale 2 puffs into the lungs every 4 (four) hours as needed for wheezing or shortness of breath.   albuterol 108 (90 Base) MCG/ACT inhaler Commonly known as:  PROVENTIL HFA;VENTOLIN HFA Inhale 2 puffs into the lungs every 4 (four) hours as needed for wheezing or shortness of breath.   amoxicillin-clavulanate 875-125 MG tablet Commonly known as:  Augmentin Take 1 tablet by mouth 2 (two) times daily for 10 days.   atorvastatin 10 MG tablet Commonly known as:  LIPITOR Take 10 mg by mouth daily at 6 PM.   azelastine 0.1 % nasal spray Commonly known as:  ASTELIN Place 1-2 sprays into both nostrils 2 (two) times daily as needed.   budesonide-formoterol 160-4.5 MCG/ACT inhaler  Commonly known as:  SYMBICORT Inhale 2 puffs into the lungs 2 (two) times daily.   CALCIUM 1200 PO Take by mouth.   clonazePAM 1 MG tablet Commonly known as:  KLONOPIN Take 1 mg by mouth 3 (three) times daily as needed for anxiety.   DULoxetine 60 MG capsule Commonly known as:  CYMBALTA Take 60 mg by mouth.   famotidine 20 MG tablet Commonly known as:  PEPCID Take 20 mg by mouth 2 (two) times daily.   fluticasone 50 MCG/ACT nasal spray Commonly known as:  FLONASE Two sprays each nostril at night for nasal congestion or drainage.   levothyroxine 75 MCG tablet Commonly known as:  SYNTHROID, LEVOTHROID Take one tablet by mouth daily on an empty stomach   metFORMIN 500 MG tablet Commonly known as:  GLUCOPHAGE Take 500 mg by mouth daily with breakfast.   montelukast 10 MG tablet Commonly known as:  SINGULAIR Take 1 tablet (10 mg total) by mouth at bedtime.   Multi-Vitamins Tabs Take by mouth.   omeprazole 20  MG capsule Commonly known as:  PRILOSEC Take 1 capsule (20 mg total) by mouth daily.   promethazine 25 MG tablet Commonly known as:  PHENERGAN Take by mouth.   telmisartan 40 MG tablet Commonly known as:  MICARDIS Take by mouth.   traZODone 50 MG tablet Commonly known as:  DESYREL Take 100 mg by mouth at bedtime.       Allergies  Allergen Reactions  . Lisinopril Nausea Only and Other (See Comments)    Typical ACE Inhibitor Cough cough  . Moxifloxacin Nausea Only and Hives    Other reaction(s): HIVES  . Doxycycline Nausea Only  . Codeine Nausea And Vomiting  . Oxycodone-Acetaminophen Itching  . Prednisone Anxiety   Review of systems: Review of systems negative except as noted in HPI / PMHx or noted below: Constitutional: Negative.  HENT: Negative.   Eyes: Negative.  Respiratory: Negative.   Cardiovascular: Negative.  Gastrointestinal: Negative.  Genitourinary: Negative.  Musculoskeletal: Negative.  Neurological: Negative.   Endo/Heme/Allergies: Negative.  Cutaneous: Negative.  Past Medical History:  Diagnosis Date  . Allergic rhinitis   . Asthma   . Hypertension   . Hypothyroid     Family History  Problem Relation Age of Onset  . Asthma Daughter   . Allergic rhinitis Neg Hx   . Angioedema Neg Hx   . Eczema Neg Hx   . Immunodeficiency Neg Hx   . Urticaria Neg Hx     Social History   Socioeconomic History  . Marital status: Single    Spouse name: Not on file  . Number of children: Not on file  . Years of education: Not on file  . Highest education level: Not on file  Occupational History  . Not on file  Social Needs  . Financial resource strain: Not on file  . Food insecurity:    Worry: Not on file    Inability: Not on file  . Transportation needs:    Medical: Not on file    Non-medical: Not on file  Tobacco Use  . Smoking status: Never Smoker  . Smokeless tobacco: Never Used  Substance and Sexual Activity  . Alcohol use: Yes    Alcohol/week: 1.0 standard drinks    Types: 1 Glasses of wine per week    Comment: occasional glass of wine  . Drug use: No  . Sexual activity: Not Currently  Lifestyle  . Physical activity:    Days per week: Not on file    Minutes per session: Not on file  . Stress: Not on file  Relationships  . Social connections:    Talks on phone: Not on file    Gets together: Not on file    Attends religious service: Not on file    Active member of club or organization: Not on file    Attends meetings of clubs or organizations: Not on file    Relationship status: Not on file  . Intimate partner violence:    Fear of current or ex partner: Not on file    Emotionally abused: Not on file    Physically abused: Not on file    Forced sexual activity: Not on file  Other Topics Concern  . Not on file  Social History Narrative  . Not on file    I appreciate the opportunity to take part in Avamarie's care. Please do not hesitate to contact me with questions.   Sincerely,   R. Jorene Guest, MD

## 2018-08-03 NOTE — Assessment & Plan Note (Signed)
Most likely due to thick postnasal drainage and acid reflux.  Treatment plan as outlined above.

## 2018-08-03 NOTE — Assessment & Plan Note (Signed)
   A prescription has been provided for Augmentin 875-125 mg twice daily x10 days.  Prednisone has been provided, 20 mg x 4 days, 10 mg x1 day, then stop.  A prescription has been provided for azelastine nasal spray, 1-2 sprays per nostril 2 times daily as needed.   May continue using fluticasone nasal spray as needed.  Nasal saline lavage (NeilMed) has been recommended as needed and prior to medicated nasal sprays along with instructions for proper administration.  For thick post nasal drainage, add guaifenesin [phone] mg (Mucinex)  twice daily as needed with adequate hydration as discussed.  Hold cetirizine (Zyrtec) for now.

## 2018-08-03 NOTE — Assessment & Plan Note (Signed)
   Restart Symbicort 160-4.5 micro grams, 2 inhalations twice daily.  Samples have been provided.  To maximize pulmonary deposition, a spacer has been provided along with instructions for its proper administration with an HFA inhaler.  Continue montelukast 10 mg daily at bedtime and albuterol every 4-6 hours as needed.  Subjective and objective measures of pulmonary function will be followed and the treatment plan will be adjusted accordingly.

## 2018-08-03 NOTE — Patient Instructions (Addendum)
Acute sinusitis  A prescription has been provided for Augmentin 875-125 mg twice daily x10 days.  Prednisone has been provided, 20 mg x 4 days, 10 mg x1 day, then stop.  A prescription has been provided for azelastine nasal spray, 1-2 sprays per nostril 2 times daily as needed.   May continue using fluticasone nasal spray as needed.  Nasal saline lavage (NeilMed) has been recommended as needed and prior to medicated nasal sprays along with instructions for proper administration.  For thick post nasal drainage, add guaifenesin [phone] mg (Mucinex)  twice daily as needed with adequate hydration as discussed.  Hold cetirizine (Zyrtec) for now.  Gastroesophageal reflux disease without esophagitis  Appropriate reflux lifestyle modifications have been provided.  Continue famotidine 20 mg twice daily.  A prescription has been provided for omeprazole 20 mg daily, 30 minutes prior to breakfast.  Moderate persistent asthma  Restart Symbicort 160-4.5 micro grams, 2 inhalations twice daily.  Samples have been provided.  To maximize pulmonary deposition, a spacer has been provided along with instructions for its proper administration with an HFA inhaler.  Continue montelukast 10 mg daily at bedtime and albuterol every 4-6 hours as needed.  Subjective and objective measures of pulmonary function will be followed and the treatment plan will be adjusted accordingly.  Cough, persistent Most likely due to thick postnasal drainage and acid reflux.  Treatment plan as outlined above.   Return in about 4 months, or if symptoms worsen or fail to improve.

## 2018-11-10 ENCOUNTER — Telehealth: Payer: Self-pay | Admitting: *Deleted

## 2018-11-10 MED ORDER — PREDNISONE 10 MG PO TABS: ORAL_TABLET | ORAL | 0 refills | Status: DC

## 2018-11-10 NOTE — Telephone Encounter (Signed)
Pt informed. Med sent

## 2018-11-10 NOTE — Telephone Encounter (Signed)
Please call in prednisone, 20 mg x 4 days, 10 mg x1 day, then stop. Continue other asthma medications as prescribed. Call the on-call doctor if this problem persists or progresses over the weekend. Get scheduled to be seen next week for follow-up. Thanks.

## 2018-11-10 NOTE — Telephone Encounter (Signed)
Pt called complaining of chest congestion cough and slight wheezing for 1 week. She is using all prescribed meds and alb inhaler 2-3 times a day. She has brown mucus production. She will call the on call doctor if her symptoms get any worse. She is having covid testing on Thursday for clearance for her cataract surgery on the 15th.

## 2018-11-14 NOTE — Telephone Encounter (Signed)
Spoke with pt she is doing better. Scheduled apt for this week with anne on 7/8 @ 10:30

## 2018-11-16 ENCOUNTER — Encounter: Payer: Self-pay | Admitting: Family Medicine

## 2018-11-16 ENCOUNTER — Ambulatory Visit: Payer: Medicare HMO | Admitting: Family Medicine

## 2018-11-16 ENCOUNTER — Other Ambulatory Visit: Payer: Self-pay

## 2018-11-16 VITALS — BP 126/72 | HR 101 | Temp 97.2°F | Resp 18 | Ht 64.2 in | Wt 179.4 lb

## 2018-11-16 DIAGNOSIS — J3089 Other allergic rhinitis: Secondary | ICD-10-CM | POA: Diagnosis not present

## 2018-11-16 DIAGNOSIS — R05 Cough: Secondary | ICD-10-CM | POA: Diagnosis not present

## 2018-11-16 DIAGNOSIS — K219 Gastro-esophageal reflux disease without esophagitis: Secondary | ICD-10-CM

## 2018-11-16 DIAGNOSIS — J4540 Moderate persistent asthma, uncomplicated: Secondary | ICD-10-CM

## 2018-11-16 DIAGNOSIS — R053 Chronic cough: Secondary | ICD-10-CM

## 2018-11-16 MED ORDER — MONTELUKAST SODIUM 10 MG PO TABS: 10.0000 mg | ORAL_TABLET | Freq: Every day | ORAL | 5 refills | Status: DC

## 2018-11-16 MED ORDER — ALBUTEROL SULFATE HFA 108 (90 BASE) MCG/ACT IN AERS: 2.0000 | INHALATION_SPRAY | RESPIRATORY_TRACT | 5 refills | Status: DC | PRN

## 2018-11-16 NOTE — Patient Instructions (Addendum)
Asthma Continue Symbicort 160-2 puffs every 12 hours with a spacer to prevent coughing or wheezing Continue montelukast 10 mg once a day to prevent cough or wheeze Continue Ventolin 2 puffs every 4 hours if needed for wheezing or coughing spells  Allergic rhinitis Begin nasal saline rinses as needed for nasal symptoms Continue Mucinex [phone] mg twice a day to thin secretions Stop cetirizine for 4-5 days while taking Mucinex Continue azelastine nasal spray 1-2 sprays in each nostril 2 times a day Continue Flonase 2 sprays is each nostril as needed for a stuffy nose  Cough Improving Begin nasal saline rinses as needed for nasal symptoms (as above) Continue Mucinex [phone] mg twice a day to thin secretions (as above) Stop cetirizine for 4-5 days while taking Mucinex (as above)  Reflux Continue omeprazole 20 mg once a day for reflux Continue dietary and lifestyle modifications as listed below  Call us if you are not doing well on this treatment plan  Continue on her other medications  Follow up in 3 months or sooner if needed or sooner if needed   Lifestyle Changes for Controlling GERD When you have GERD, stomach acid feels as if it's backing up toward your mouth. Whether or not you take medication to control your GERD, your symptoms can often be improved with lifestyle changes.   Raise Your Head  Reflux is more likely to strike when you're lying down flat, because stomach fluid can  flow backward more easily. Raising the head of your bed 4-6 inches can help. To do this:  Slide blocks or books under the legs at the head of your bed. Or, place a wedge under  the mattress. Many foam stores can make a suitable wedge for you. The wedge  should run from your waist to the top of your head.  Don't just prop your head on several pillows. This increases pressure on your  stomach. It can make GERD worse.  Watch Your Eating Habits Certain foods may increase the acid in your  stomach or relax the lower esophageal sphincter, making GERD more likely. It's best to avoid the following:  Coffee, tea, and carbonated drinks (with and without caffeine)  Fatty, fried, or spicy food  Mint, chocolate, onions, and tomatoes  Any other foods that seem to irritate your stomach or cause you pain  Relieve the Pressure  Eat smaller meals, even if you have to eat more often.  Don't lie down right after you eat. Wait a few hours for your stomach to empty.  Avoid tight belts and tight-fitting clothes.  Lose excess weight.  Tobacco and Alcohol  Avoid smoking tobacco and drinking alcohol. They can make GERD symptoms worse.

## 2018-11-16 NOTE — Progress Notes (Addendum)
[address] [place] [address] Dept: [phone]  FOLLOW UP NOTE  Patient ID: Annette Wade, female    DOB: 02/18/1951  Age: 68 y.o. MRN: [id number] Date of Office Visit: 11/16/2018  Assessment  Chief Complaint: Asthma (okay)  HPI Annette [place] ([place]) is a 68 year old female who presents to the clinic for a follow up visit. She was last seen in the clinic on 08/03/2018 by Dr. Verlin Fester for acute sinusitis requiring antibiotic and prednisone, allergic rhinitis, and reflux. In the interim, she contacted the clinic on 11/10/2018 via telephone to report symptoms including chest congestion and wheezing for which she took prednisone for 5 days. At today's visit, she reports her symptoms have improved significantly. She reports her asthma as well controlled with no shortness of breath or wheezing with activity or rest. She continues to experience an occasional cough that is reported as a combination of clear mucus producing cough and dry cough. She continues Symbicort 160-2 puffs twice a day, montelukast 10 mg once a day and albuterol 1 time a week. Allergic rhinitis has not been well controlled with thick post nasal drainage, nasal congestion, and increased throat clearing for the last week. However, these symptoms are beginning to improve. She continues cetirizine and azelastine nasal spray daily and Flonase as needed. She has been taking Mucinex once a day for the last few days. Reflux is reported as well controlled with no heartburn. She continues dietary modifications and takes omeprazole 20 mg once a day. Her current medications are listed in the chart.   Drug Allergies:  Allergies  Allergen Reactions  . Lisinopril Nausea Only and Other (See Comments)    Typical ACE Inhibitor Cough cough  . Moxifloxacin Nausea Only and Hives    Other reaction(s): HIVES  . Doxycycline Nausea Only  . Codeine Nausea And Vomiting  . Oxycodone-Acetaminophen Itching  . Prednisone Anxiety    Physical Exam:  BP 126/72 (BP Location: Right Arm, Patient Position: Sitting, Cuff Size: Normal)   Pulse (!) 101   Temp (!) 97.2 F (36.2 C) (Temporal)   Resp 18   Ht 5' 4.2" (1.631 m)   Wt 179 lb 6.4 oz (81.4 kg)   SpO2 95%   BMI 30.60 kg/m    Physical Exam Vitals signs reviewed.  Constitutional:      Appearance: Normal appearance.  HENT:     Head: Normocephalic and atraumatic.     Right Ear: Tympanic membrane normal.     Left Ear: Tympanic membrane normal.     Nose:     Comments: Bilateral nares slightly erythematous with clear nasal drainage noted. Pharynx normal. Ears normal. Eyes normal.    Mouth/Throat:     Pharynx: Oropharynx is clear.  Eyes:     Conjunctiva/sclera: Conjunctivae normal.  Neck:     Musculoskeletal: Normal range of motion and neck supple.  Cardiovascular:     Rate and Rhythm: Normal rate and regular rhythm.     Heart sounds: Normal heart sounds. No murmur.  Pulmonary:     Effort: Pulmonary effort is normal.     Breath sounds: Normal breath sounds.     Comments: Lungs clear to auscultation Musculoskeletal: Normal range of motion.  Skin:    General: Skin is warm and dry.  Neurological:     Mental Status: She is alert and oriented to person, place, and time.  Psychiatric:        Mood and Affect: Mood normal.        Behavior:  Behavior normal.        Thought Content: Thought content normal.        Judgment: Judgment normal.     Diagnostics: FVC 2.92, FEV1 2.19.  Predicted FVC 3.02, predicted FEV1 2.28.  Spirometry indicates normal ventilatory function.  Assessment and Plan: 1. Moderate persistent asthma without complication   2. Other allergic rhinitis   3. Cough, persistent   4. Gastroesophageal reflux disease without esophagitis     Meds ordered this encounter  Medications  . albuterol (PROAIR HFA) 108 (90 Base) MCG/ACT inhaler    Sig: Inhale 2 puffs into the lungs every 4 (four) hours as needed for wheezing or shortness of breath.    Dispense:  18 g     Refill:  5  . montelukast (SINGULAIR) 10 MG tablet    Sig: Take 1 tablet (10 mg total) by mouth at bedtime.    Dispense:  30 tablet    Refill:  5    Patient Instructions  Asthma Continue Symbicort 160-2 puffs every 12 hours with a spacer to prevent coughing or wheezing Continue montelukast 10 mg once a day to prevent cough or wheeze Continue Ventolin 2 puffs every 4 hours if needed for wheezing or coughing spells  Allergic rhinitis Begin nasal saline rinses as needed for nasal symptoms Continue Mucinex [phone] mg twice a day to thin secretions Stop cetirizine for 4-5 days while taking Mucinex Continue azelastine nasal spray 1-2 sprays in each nostril 2 times a day Continue Flonase 2 sprays is each nostril as needed for a stuffy nose  Cough Improving Begin nasal saline rinses as needed for nasal symptoms (as above) Continue Mucinex [phone] mg twice a day to thin secretions (as above) Stop cetirizine for 4-5 days while taking Mucinex (as above)  Reflux Continue omeprazole 20 mg once a day for reflux Continue dietary and lifestyle modifications as listed below  Call us if you are not doing well on this treatment plan  Continue on her other medications  Follow up in 3 months or sooner if needed or sooner if needed   Return in about 3 months (around 02/16/2019), or if symptoms worsen or fail to improve.    Thank you for the opportunity to care for this patient.  Please do not hesitate to contact me with questions.  [name], FNP [facility]  _________________________________________________  I have provided oversight concerning Annette Wade's evaluation and treatment of this patient's health issues addressed during today's encounter.  I agree with the assessment and therapeutic plan as outlined in the note.   Signed,   R Jorene Guestarter Bobbitt, MD

## 2019-02-27 ENCOUNTER — Other Ambulatory Visit: Payer: Self-pay | Admitting: Allergy and Immunology

## 2019-06-09 ENCOUNTER — Other Ambulatory Visit: Payer: Self-pay

## 2019-06-09 ENCOUNTER — Encounter: Payer: Self-pay | Admitting: Allergy

## 2019-06-09 ENCOUNTER — Ambulatory Visit: Payer: Medicare HMO | Admitting: Allergy

## 2019-06-09 VITALS — BP 120/80 | HR 100 | Temp 97.8°F | Resp 20 | Ht 64.0 in | Wt 174.6 lb

## 2019-06-09 DIAGNOSIS — J3089 Other allergic rhinitis: Secondary | ICD-10-CM

## 2019-06-09 DIAGNOSIS — J4541 Moderate persistent asthma with (acute) exacerbation: Secondary | ICD-10-CM | POA: Diagnosis not present

## 2019-06-09 DIAGNOSIS — J0100 Acute maxillary sinusitis, unspecified: Secondary | ICD-10-CM | POA: Diagnosis not present

## 2019-06-09 DIAGNOSIS — K219 Gastro-esophageal reflux disease without esophagitis: Secondary | ICD-10-CM | POA: Diagnosis not present

## 2019-06-09 MED ORDER — AMOXICILLIN-POT CLAVULANATE 875-125 MG PO TABS: 1.0000 | ORAL_TABLET | Freq: Two times a day (BID) | ORAL | 0 refills | Status: DC

## 2019-06-09 MED ORDER — OMEPRAZOLE 20 MG PO CPDR: 20.0000 mg | DELAYED_RELEASE_CAPSULE | Freq: Every day | ORAL | 1 refills | Status: AC

## 2019-06-09 NOTE — Progress Notes (Signed)
Follow Up Note  RE: [place] MRN: [id number] DOB: 10/27/1950 Date of Office Visit: 06/09/2019  Referring provider: Keane Scrape* Primary care provider: Jonathon Bellows, PA-C  Chief Complaint: Cough, Nasal Congestion, and Wheezing  History of Present Illness: I had the pleasure of seeing Annette Wade for a follow up visit at the [facility] on 06/09/2019. She is a 69 y.o. female, who is being followed for asthma, allergic rhinitis, cough and reflux. Her previous allergy office visit was on 11/16/2018 with Gareth Morgan, [place]. Today is a new complaint visit of asthma flare.  Asthma Patient has been waking up at night with wheezing for the past few weeks and has been using albuterol with some benefit. Currently on Symbicort 160 2 puffs twice a day with spacer and montelukast daily. She is also coughing with some mucous, shortness of breath, chest tightness.  No oral prednisone or antibiotics since the last visit. No fevers or chills. No sick contacts.   Allergic rhinitis Some facial pain and pressure with sinus headaches and some yellowish drainage. Currently using mucinex, azelastine 2 sprays per nostril twice a day. Uses saline spray as needed.  Not taking antihistamines or the Flonase.   Reflux Takes omeprazole 20mg  in the morning with good benefit.  Assessment and Plan: Annette is a 69 y.o. female with: Moderate persistent asthma with acute exacerbation Increased wheezing, coughing the last few weeks. Using albuterol at night with good benefit. . She most likely has acute exacerbation. Last exacerbation/prednisone use was in March 2020.  Marland Kitchen Start prednisone taper - 20mg  daily days and then 10mg  daily x 1 day.  . For the next 3-5 days, take albuterol 2 puffs prior to using the Symbicort.  . Daily controller medication(s): continue Symbicort 160 2 puffs twice a day. o Continue montelukast 10mg  once a day at night.  . Prior to  physical activity: May use albuterol rescue inhaler 2 puffs 5 to 15 minutes prior to strenuous physical activities. Marland Kitchen Rescue medications: May use albuterol rescue inhaler 2 puffs or nebulizer every 4 to 6 hours as needed for shortness of breath, chest tightness, coughing, and wheezing. Monitor frequency of use.   Acute maxillary sinusitis Increased facial pressure, drainage and headaches.  If sinus symptoms don't improve with the prednisone and Flonase 2 sprays once a day then start Augmentin 875mg  1 pill twice a day for 10 days.   Other allergic rhinitis Past history - 2018 skin testing was positive to mold. Interim history - Stopped Flonase as she thought the azelastine replaced the Flonase.   Continue Mucinex [phone] mg twice a day to thin secretions. Take with plenty of water.   Continue azelastine nasal spray 1-2 sprays in each nostril 2 times a day for drainage.   Restart Flonase 2 sprays per nostril daily as needed for a stuffy nose.  Nasal saline spray (i.e., Simply Saline) or nasal saline lavage (i.e., NeilMed) is recommended as needed and prior to medicated nasal sprays.  Gastroesophageal reflux disease without esophagitis Stable with omeprazole.  Continue omeprazole 20 mg once a day for reflux.  Return in about 6 months (around 12/07/2019).  Meds ordered this encounter  Medications  . omeprazole (PRILOSEC) 20 MG capsule    Sig: Take 1 capsule (20 mg total) by mouth daily.    Dispense:  90 capsule    Refill:  1    Please dispense 90 day supply.  Marland Kitchen amoxicillin-clavulanate (AUGMENTIN) 875-125 MG tablet  Sig: Take 1 tablet by mouth 2 (two) times daily.    Dispense:  20 tablet    Refill:  0   Diagnostics: None.  Medication List:  Current Outpatient Medications  Medication Sig Dispense Refill  . diclofenac Sodium (VOLTAREN) 1 % GEL Apply 2 grams to affected area 3 times daily as needed    . ipratropium (ATROVENT) 0.06 % nasal spray Place into the nose.    .  albuterol (PROAIR HFA) 108 (90 Base) MCG/ACT inhaler Inhale 2 puffs into the lungs every 4 (four) hours as needed for wheezing or shortness of breath. 18 g 5  . amoxicillin-clavulanate (AUGMENTIN) 875-125 MG tablet Take 1 tablet by mouth 2 (two) times daily. 20 tablet 0  . atorvastatin (LIPITOR) 10 MG tablet Take 10 mg by mouth daily at 6 PM.     . budesonide-formoterol (SYMBICORT) 160-4.5 MCG/ACT inhaler Inhale 2 puffs into the lungs 2 (two) times daily. 1 Inhaler 5  . Calcium Carbonate-Vit D-Min (CALCIUM 1200 PO) Take by mouth.    . clonazePAM (KLONOPIN) 1 MG tablet Take 1 mg by mouth 3 (three) times daily as needed for anxiety.     . diazepam (VALIUM) 2 MG tablet TAKE 3 TABLETS BY MOUTH 1 HOUR PRIOR TO APPOINTMENT    . DULoxetine (CYMBALTA) 60 MG capsule Take 60 mg by mouth.    Marland Kitchen glipiZIDE (GLUCOTROL XL) 5 MG 24 hr tablet Take 5 mg by mouth every morning.    Marland Kitchen levothyroxine (SYNTHROID, LEVOTHROID) 75 MCG tablet Take one tablet by mouth daily on an empty stomach    . metFORMIN (GLUCOPHAGE) 500 MG tablet Take 500 mg by mouth daily with breakfast.     . montelukast (SINGULAIR) 10 MG tablet Take 1 tablet (10 mg total) by mouth at bedtime. 30 tablet 5  . Multiple Vitamin (MULTI-VITAMINS) TABS Take by mouth.    Marland Kitchen omeprazole (PRILOSEC) 20 MG capsule Take 1 capsule (20 mg total) by mouth daily. 90 capsule 1  . promethazine (PHENERGAN) 25 MG tablet Take by mouth.    . telmisartan (MICARDIS) 40 MG tablet Take by mouth.    Marland Kitchen tiZANidine (ZANAFLEX) 4 MG tablet Take 4 mg by mouth 2 (two) times daily as needed.    . traZODone (DESYREL) 50 MG tablet Take 100 mg by mouth at bedtime.      No current facility-administered medications for this visit.   Allergies: Allergies  Allergen Reactions  . Lisinopril Nausea Only and Other (See Comments)    Typical ACE Inhibitor Cough cough  . Moxifloxacin Nausea Only and Hives    Other reaction(s): HIVES  . Doxycycline Nausea Only  . Codeine Nausea And Vomiting    . Oxycodone-Acetaminophen Itching  . Prednisone Anxiety   I reviewed her past medical history, social history, family history, and environmental history and no significant changes have been reported from her previous visit.  Review of Systems  Constitutional: Negative for appetite change, chills, fever and unexpected weight change.  HENT: Positive for congestion, postnasal drip, sinus pressure and sinus pain. Negative for rhinorrhea.   Eyes: Negative for itching.  Respiratory: Positive for cough, chest tightness, shortness of breath and wheezing.   Gastrointestinal: Negative for abdominal pain.  Skin: Negative for rash.  Allergic/Immunologic: Positive for environmental allergies.  Neurological: Negative for headaches.   Objective: BP 120/80 (BP Location: Left Arm, Patient Position: Sitting, Cuff Size: Normal)   Pulse 100   Temp 97.8 F (36.6 C) (Oral)   Resp 20   Ht  5\' 4"  (1.626 m)   Wt 174 lb 9.6 oz (79.2 kg)   SpO2 98%   BMI 29.97 kg/m  Body mass index is 29.97 kg/m. Physical Exam  Constitutional: She is oriented to person, place, and time. She appears well-developed and well-nourished.  HENT:  Head: Normocephalic and atraumatic.  Right Ear: External ear normal.  Left Ear: External ear normal.  Nose: Nose normal.  Mouth/Throat: Oropharynx is clear and moist.  Eyes: Conjunctivae and EOM are normal.  Cardiovascular: Normal rate, regular rhythm and normal heart sounds. Exam reveals no gallop and no friction rub.  No murmur heard. Pulmonary/Chest: Effort normal and breath sounds normal. She has no wheezes. She has no rales.  Musculoskeletal:     Cervical back: Neck supple.  Neurological: She is alert and oriented to person, place, and time.  Skin: Skin is warm. No rash noted.  Psychiatric: She has a normal mood and affect. Her behavior is normal.  Nursing note and vitals reviewed.  Previous notes and tests were reviewed. The plan was reviewed with the patient/family,  and all questions/concerned were addressed.  It was my pleasure to see today and participate in her care. Please feel free to contact me with any questions or concerns.  Sincerely,  [place], DO Allergy & Immunology  [facility] office: [phone] [facility] office: [phone] [place] office: [phone]

## 2019-06-09 NOTE — Assessment & Plan Note (Signed)
Increased wheezing, coughing the last few weeks. Using albuterol at night with good benefit. . She most likely has acute exacerbation. Last exacerbation/prednisone use was in March 2020.  Marland Kitchen Start prednisone taper - 20mg  daily days and then 10mg  daily x 1 day.  . For the next 3-5 days, take albuterol 2 puffs prior to using the Symbicort.  . Daily controller medication(s): continue Symbicort 160 2 puffs twice a day. o Continue montelukast 10mg  once a day at night.  . Prior to physical activity: May use albuterol rescue inhaler 2 puffs 5 to 15 minutes prior to strenuous physical activities. Rescue medications: May use albuterol rescue inhaler 2 puffs or nebulizer every 4 to 6 hours as needed for shortness of breath, chest tightness, coughing, and wheezing. Monitor frequency of use.

## 2019-06-09 NOTE — Assessment & Plan Note (Addendum)
Increased facial pressure, drainage and headaches.  If sinus symptoms don't improve with the prednisone and Flonase 2 sprays once a day then start Augmentin 875mg  1 pill twice a day for 10 days.

## 2019-06-09 NOTE — Patient Instructions (Addendum)
Asthma with acute exacerbation . Start prednisone taper.  . For the next 3-5 days, take albuterol 2 puffs prior to using the Symbicort.  . Daily controller medication(s): continue Symbicort 160 2 puffs twice a day. o Continue montelukast 10mg  once a day at night.  . Prior to physical activity: May use albuterol rescue inhaler 2 puffs 5 to 15 minutes prior to strenuous physical activities. Rescue medications: May use albuterol rescue inhaler 2 puffs or nebulizer every 4 to 6 hours as needed for shortness of breath, chest tightness, coughing, and wheezing. Monitor frequency of use.  . Asthma control goals:  o Full participation in all desired activities (may need albuterol before activity) o Albuterol use two times or less a week on average (not counting use with activity) o Cough interfering with sleep two times or less a month o Oral steroids no more than once a year o No hospitalizations  Sinusitis   If your sinus symptoms don't improve with the prednisone and Flonase then start Augmentin 875mg  1 pill twice a day for 10 days.   Allergic rhinitis Past history - 2018 skin testing was positive to mold.  Continue Mucinex [phone] mg twice a day to thin secretions. Take with plenty of water.   Continue azelastine nasal spray 1-2 sprays in each nostril 2 times a day for drainage.   Restart Flonase 2 sprays per nostril daily as needed for a stuffy nose.  Nasal saline spray (i.e., Simply Saline) or nasal saline lavage (i.e., NeilMed) is recommended as needed and prior to medicated nasal sprays.  Reflux  Continue omeprazole 20 mg once a day for reflux  Follow up in 6 months or sooner if needed.

## 2019-06-09 NOTE — Assessment & Plan Note (Signed)
Stable with omeprazole.  Continue omeprazole 20 mg once a day for reflux.

## 2019-06-09 NOTE — Assessment & Plan Note (Signed)
Past history - 2018 skin testing was positive to mold. Interim history - Stopped Flonase as she thought the azelastine replaced the Flonase.   Continue Mucinex [phone] mg twice a day to thin secretions. Take with plenty of water.   Continue azelastine nasal spray 1-2 sprays in each nostril 2 times a day for drainage.   Restart Flonase 2 sprays per nostril daily as needed for a stuffy nose.  Nasal saline spray (i.e., Simply Saline) or nasal saline lavage (i.e., NeilMed) is recommended as needed and prior to medicated nasal sprays.

## 2019-09-19 ENCOUNTER — Other Ambulatory Visit: Payer: Self-pay | Admitting: Orthopedic Surgery

## 2019-09-19 DIAGNOSIS — M545 Low back pain, unspecified: Secondary | ICD-10-CM

## 2019-09-21 ENCOUNTER — Other Ambulatory Visit (HOSPITAL_COMMUNITY): Payer: Self-pay | Admitting: Orthopedic Surgery

## 2019-09-21 ENCOUNTER — Other Ambulatory Visit: Payer: Self-pay | Admitting: Orthopedic Surgery

## 2019-09-21 DIAGNOSIS — M545 Low back pain, unspecified: Secondary | ICD-10-CM

## 2019-09-29 ENCOUNTER — Other Ambulatory Visit: Payer: Self-pay

## 2019-09-29 ENCOUNTER — Encounter (HOSPITAL_COMMUNITY)
Admission: RE | Admit: 2019-09-29 | Discharge: 2019-09-29 | Disposition: A | Discharge status: Home / Self Care | Payer: Medicare HMO | Source: Ambulatory Visit | Attending: Orthopedic Surgery | Admitting: Orthopedic Surgery

## 2019-09-29 ENCOUNTER — Ambulatory Visit (HOSPITAL_COMMUNITY)
Admission: RE | Admit: 2019-09-29 | Discharge: 2019-09-29 | Disposition: A | Discharge status: Home / Self Care | Payer: Medicare HMO | Source: Ambulatory Visit | Attending: Orthopedic Surgery | Admitting: Orthopedic Surgery

## 2019-09-29 DIAGNOSIS — M545 Low back pain, unspecified: Secondary | ICD-10-CM

## 2019-09-29 MED ORDER — TECHNETIUM TC 99M MEDRONATE IV KIT
21.7000 | PACK | Freq: Once | INTRAVENOUS | Status: AC | PRN
  Administered 2019-09-29: 21.7 via INTRAVENOUS

## 2019-10-04 ENCOUNTER — Ambulatory Visit
Admission: RE | Admit: 2019-10-04 | Discharge: 2019-10-04 | Disposition: A | Discharge status: Home / Self Care | Payer: Medicare HMO | Source: Ambulatory Visit | Attending: Orthopedic Surgery | Admitting: Orthopedic Surgery

## 2019-10-04 ENCOUNTER — Other Ambulatory Visit: Payer: Self-pay

## 2019-10-04 DIAGNOSIS — M545 Low back pain, unspecified: Secondary | ICD-10-CM

## 2019-10-04 MED ORDER — IOPAMIDOL (ISOVUE-300) INJECTION 61%
100.0000 mL | Freq: Once | INTRAVENOUS | Status: AC | PRN
  Administered 2019-10-04: 100 mL via INTRAVENOUS

## 2019-10-10 ENCOUNTER — Other Ambulatory Visit (HOSPITAL_COMMUNITY): Payer: Self-pay | Admitting: Orthopedic Surgery

## 2019-10-10 ENCOUNTER — Other Ambulatory Visit: Payer: Self-pay | Admitting: Orthopedic Surgery

## 2019-10-10 DIAGNOSIS — M898X5 Other specified disorders of bone, thigh: Secondary | ICD-10-CM

## 2019-10-11 ENCOUNTER — Encounter (HOSPITAL_COMMUNITY): Payer: Self-pay | Admitting: Radiology

## 2019-10-11 NOTE — Progress Notes (Signed)
Annette Wade "Ginger" Female, 69 y.o., 1951-02-03 MRN:  [id number] Phone:  [phone] Judie Petit) PCP:  Bailey Mech, PA-C Coverage:  Monia Pouch Medicare/[employer] Medicare Hmo/Ppo  RE: STAT:  CT Biopsy Received: Today Message Contents  Oley Balm, MD  Servando Salina  Ok   CT Core L lytic iliac lesion   DDH       Previous Messages   ----- Message -----  From: Henry Russel D  Sent: 10/10/2019  6:10 PM EDT  To: [facility] Procedure Requests  Subject: STAT:  CT Biopsy                 Procedure:  CT Biopsy   Reason: left ilium eval lytic lesion   History: CT, NM Bone in computer   Provider: Ranee Gosselin   Provider Contact: [phone]

## 2019-10-13 ENCOUNTER — Ambulatory Visit (HOSPITAL_COMMUNITY): Admission: RE | Admit: 2019-10-13 | Payer: Medicare HMO | Source: Ambulatory Visit

## 2019-10-16 ENCOUNTER — Other Ambulatory Visit: Payer: Self-pay | Admitting: Radiology

## 2019-10-17 ENCOUNTER — Other Ambulatory Visit: Payer: Self-pay | Admitting: Radiology

## 2019-10-18 ENCOUNTER — Other Ambulatory Visit: Payer: Self-pay

## 2019-10-18 ENCOUNTER — Encounter (HOSPITAL_COMMUNITY): Payer: Self-pay

## 2019-10-18 ENCOUNTER — Ambulatory Visit (HOSPITAL_COMMUNITY)
Admission: RE | Admit: 2019-10-18 | Discharge: 2019-10-18 | Disposition: A | Discharge status: Home / Self Care | Payer: Medicare HMO | Source: Ambulatory Visit | Attending: Orthopedic Surgery | Admitting: Orthopedic Surgery

## 2019-10-18 DIAGNOSIS — M898X5 Other specified disorders of bone, thigh: Secondary | ICD-10-CM | POA: Diagnosis not present

## 2019-10-18 LAB — CBC
HCT: 36.2 % (ref 36.0–46.0)
Hemoglobin: 11.2 g/dL — ABNORMAL LOW (ref 12.0–15.0)
MCH: 28.6 pg (ref 26.0–34.0)
MCHC: 30.9 g/dL (ref 30.0–36.0)
MCV: 92.6 fL (ref 80.0–100.0)
Platelets: 335 10*3/uL (ref 150–400)
RBC: 3.91 MIL/uL (ref 3.87–5.11)
RDW: 14.9 % (ref 11.5–15.5)
WBC: 11.5 10*3/uL — ABNORMAL HIGH (ref 4.0–10.5)
nRBC: 0 % (ref 0.0–0.2)

## 2019-10-18 LAB — GLUCOSE, CAPILLARY: Glucose-Capillary: 141 mg/dL — ABNORMAL HIGH (ref 70–99)

## 2019-10-18 LAB — PROTIME-INR
INR: 1.2 (ref 0.8–1.2)
Prothrombin Time: 14.5 seconds (ref 11.4–15.2)

## 2019-10-18 MED ORDER — FENTANYL CITRATE (PF) 100 MCG/2ML IJ SOLN
INTRAMUSCULAR | Status: AC
  Filled 2019-10-18: qty 4

## 2019-10-18 MED ORDER — SODIUM CHLORIDE 0.9 % IV SOLN: INTRAVENOUS | Status: DC

## 2019-10-18 MED ORDER — MIDAZOLAM HCL 2 MG/2ML IJ SOLN
INTRAMUSCULAR | Status: AC | PRN
  Administered 2019-10-18: 1 mg via INTRAVENOUS
  Administered 2019-10-18: 0.5 mg via INTRAVENOUS

## 2019-10-18 MED ORDER — MIDAZOLAM HCL 2 MG/2ML IJ SOLN
INTRAMUSCULAR | Status: AC
  Filled 2019-10-18: qty 4

## 2019-10-18 MED ORDER — FENTANYL CITRATE (PF) 100 MCG/2ML IJ SOLN
INTRAMUSCULAR | Status: AC | PRN
  Administered 2019-10-18: 50 ug via INTRAVENOUS
  Administered 2019-10-18: 25 ug via INTRAVENOUS

## 2019-10-18 NOTE — H&P (Signed)
Chief Complaint: Patient was seen in consultation today for left iliac bone lesion biopsy at the request of Gioffre,Ronald  Referring Physician(s): Gioffre,Ronald  Supervising Physician: Oley Balm  Patient Status: [facility] - Out-pt  History of Present Illness: Annette Wade is a 69 y.o. female   Low back pain after fall few months ago Persistent pain-- to PMD Bone scan 09/29/19: IMPRESSION: 1. Asymmetric radiotracer uptake along the inferior margin of the left sacroiliac joint, which may reflect sacroiliitis. Further evaluation with MRI may be useful. 2. Degenerative type activity of the lumbar spine, left hip, and bilateral patellofemoral compartments of the knees.  CT 5/26: IMPRESSION: 1. Asymmetric radiotracer uptake along the inferior margin of the left sacroiliac joint, which may reflect sacroiliitis. Further evaluation with MRI may be useful. 2. Degenerative type activity of the lumbar spine, left hip, and bilateral patellofemoral compartments of the knees.  Referred to Dr Darrelyn Hillock Scheduled now for biopsy of left iliac bone lesion    Past Medical History:  Diagnosis Date  . Allergic rhinitis   . Asthma   . Hypertension   . Hypothyroid     Past Surgical History:  Procedure Laterality Date  . ADENOIDECTOMY    . BASAL CELL CARCINOMA EXCISION    . CESAREAN SECTION    . CHOLECYSTECTOMY    . SINOSCOPY    . TONSILLECTOMY      Allergies: Lisinopril, Moxifloxacin, Doxycycline, Codeine, Oxycodone-acetaminophen, and Prednisone  Medications: Prior to Admission medications   Medication Sig Start Date End Date Taking? Authorizing Provider  albuterol (PROAIR HFA) 108 (90 Base) MCG/ACT inhaler Inhale 2 puffs into the lungs every 4 (four) hours as needed for wheezing or shortness of breath. 11/16/18  Yes Ambs, Norvel Richards, FNP  atorvastatin (LIPITOR) 10 MG tablet Take 10 mg by mouth daily at 6 PM.  08/09/17  Yes [provider]  budesonide-formoterol  (SYMBICORT) 160-4.5 MCG/ACT inhaler Inhale 2 puffs into the lungs 2 (two) times daily. 08/03/18  Yes Bobbitt, Heywood Iles, MD  Calcium Carbonate-Vit D-Min (CALCIUM 1200 PO) Take 1 tablet by mouth daily.    Yes [provider]  clonazePAM (KLONOPIN) 1 MG tablet Take 1 mg by mouth 3 (three) times daily as needed for anxiety.    Yes [provider]  DULoxetine (CYMBALTA) 60 MG capsule Take 120 mg by mouth daily.    Yes [provider]  glipiZIDE (GLUCOTROL XL) 5 MG 24 hr tablet Take 5 mg by mouth every morning. 04/11/19  Yes [provider]  ipratropium (ATROVENT) 0.06 % nasal spray Place 1 spray into the nose daily.  05/19/18  Yes [provider]  levothyroxine (SYNTHROID, LEVOTHROID) 75 MCG tablet Take 75 mcg by mouth daily before breakfast.  04/29/15  Yes [provider]  metFORMIN (GLUCOPHAGE) 500 MG tablet Take 500 mg by mouth daily with breakfast.  08/04/17  Yes [provider]  montelukast (SINGULAIR) 10 MG tablet Take 1 tablet (10 mg total) by mouth at bedtime. 11/16/18  Yes Ambs, Norvel Richards, FNP  Multiple Vitamin (MULTI-VITAMINS) TABS Take 1 tablet by mouth daily.    Yes [provider]  omeprazole (PRILOSEC) 20 MG capsule Take 1 capsule (20 mg total) by mouth daily. 06/09/19  Yes Ellamae Sia, DO  promethazine (PHENERGAN) 25 MG tablet Take 25 mg by mouth every 8 (eight) hours as needed for nausea or vomiting.    Yes [provider]  telmisartan (MICARDIS) 40 MG tablet Take 40 mg by mouth daily.  08/04/17  Yes [provider]  traZODone (DESYREL) 50 MG tablet Take 100 mg by mouth at bedtime as needed for sleep.    Yes [provider]     Family History  Problem Relation Age of Onset  . Asthma Daughter   . Allergic rhinitis Neg Hx   . Angioedema Neg Hx   . Eczema Neg Hx   . Immunodeficiency Neg Hx   . Urticaria Neg Hx     Social History   Socioeconomic History  . Marital status: Single    Spouse  name: Not on file  . Number of children: Not on file  . Years of education: Not on file  . Highest education level: Not on file  Occupational History  . Not on file  Tobacco Use  . Smoking status: Never Smoker  . Smokeless tobacco: Never Used  Substance and Sexual Activity  . Alcohol use: Yes    Alcohol/week: 1.0 standard drinks    Types: 1 Glasses of wine per week    Comment: occasional glass of wine  . Drug use: No  . Sexual activity: Not Currently  Other Topics Concern  . Not on file  Social History Narrative  . Not on file   Social Determinants of Health   Financial Resource Strain:   . Difficulty of Paying Living Expenses:   Food Insecurity:   . Worried About Charity fundraiser in the Last Year:   . Arboriculturist in the Last Year:   Transportation Needs:   . Film/video editor (Medical):   Marland Kitchen Lack of Transportation (Non-Medical):   Physical Activity:   . Days of Exercise per Week:   . Minutes of Exercise per Session:   Stress:   . Feeling of Stress :   Social Connections:   . Frequency of Communication with Friends and Family:   . Frequency of Social Gatherings with Friends and Family:   . Attends Religious Services:   . Active Member of Clubs or Organizations:   . Attends Archivist Meetings:   Marland Kitchen Marital Status:     Review of Systems: A 12 point ROS discussed and pertinent positives are indicated in the HPI above.  All other systems are negative.  Review of Systems  Constitutional: Positive for activity change. Negative for fatigue and fever.  Respiratory: Negative for shortness of breath.   Cardiovascular: Negative for chest pain.  Gastrointestinal: Negative for abdominal pain.  Musculoskeletal: Positive for back pain and gait problem.  Neurological: Negative for weakness.  Psychiatric/Behavioral: Negative for behavioral problems and confusion.    Vital Signs: BP (!) 123/98   Pulse (!) 117   Temp (!) 97.5 F (36.4 C) (Skin)   Resp  15   Ht 5\' 4"  (1.626 m)   Wt 170 lb (77.1 kg)   SpO2 99%   BMI 29.18 kg/m   Physical Exam Vitals reviewed.  Cardiovascular:     Rate and Rhythm: Normal rate and regular rhythm.     Heart sounds: Normal heart sounds.  Pulmonary:     Effort: Pulmonary effort is normal.     Breath sounds: Normal breath sounds.  Abdominal:     Palpations: Abdomen is soft.  Musculoskeletal:        General: Normal range of motion.  Skin:    General: Skin is warm and dry.  Neurological:     Mental Status: She is alert and oriented to person, place, and time.  Psychiatric:  Behavior: Behavior normal.     Imaging: NM Bone Scan Whole Body  Result Date: 09/29/2019 CLINICAL DATA:  Lower back pain with left lower leg radiculopathy for 5 months, history of trauma 04/2019 EXAM: NUCLEAR MEDICINE WHOLE BODY BONE SCAN TECHNIQUE: Whole body anterior and posterior images were obtained approximately 3 hours after intravenous injection of radiopharmaceutical. RADIOPHARMACEUTICALS:  21.7 mCi Technetium-70m MDP IV COMPARISON:  07/13/2019 FINDINGS: Anterior and posterior whole body planar images are obtained. Normal physiologic excretion of radiotracer with kidneys and bladder. There is asymmetric radiotracer activity localizing to the left sacroiliac joint inferiorly, compatible with sacroiliitis. No corresponding changes are seen on the radiograph. There is mild radiotracer deposition within the superior aspect of the left hip joint, consistent with osteoarthritis. Mild degenerative type activity also localizes to the bilateral patella, right greater than left. There is mild left convex curvature of the lumbar spine, with low level activity in the posterior elements consistent with facet hypertrophic changes seen on x-ray. IMPRESSION: 1. Asymmetric radiotracer uptake along the inferior margin of the left sacroiliac joint, which may reflect sacroiliitis. Further evaluation with MRI may be useful. 2. Degenerative type  activity of the lumbar spine, left hip, and bilateral patellofemoral compartments of the knees. Electronically Signed   By: Sharlet Salina M.D.   On: 09/29/2019 20:46   CT ABDOMEN PELVIS W CONTRAST  Result Date: 10/05/2019 CLINICAL DATA:  69 year old female with history of osseous lesions of the ileum. Evaluate for potential metastatic disease. EXAM: CT ABDOMEN AND PELVIS WITH CONTRAST TECHNIQUE: Multidetector CT imaging of the abdomen and pelvis was performed using the standard protocol following bolus administration of intravenous contrast. CONTRAST:  ISOVUE-300 IOPAMIDOL (ISOVUE-300) INJECTION 61% COMPARISON:  No priors. FINDINGS: Lower chest: Linear areas of scarring and/or atelectasis in the lower lungs bilaterally. Hepatobiliary: No suspicious cystic or solid hepatic lesions. Status post cholecystectomy. Mild intra and extrahepatic biliary ductal dilatation, likely reflective of benign post cholecystectomy physiology. Pancreas: No pancreatic mass. No pancreatic ductal dilatation. No pancreatic or peripancreatic fluid collections or inflammatory changes. Spleen: Unremarkable. Adrenals/Urinary Tract: Subcentimeter low-attenuation lesions in both kidneys, too small to definitively characterize, but statistically likely to represent tiny cysts. Bilateral adrenal glands are normal in appearance. No hydroureteronephrosis. Urinary bladder is normal in appearance. Stomach/Bowel: Normal appearance of the stomach. No pathologic dilatation of small bowel or colon. The appendix is not confidently identified and may be surgically absent. Regardless, there are no inflammatory changes noted adjacent to the cecum to suggest the presence of an acute appendicitis at this time. Vascular/Lymphatic: Aortic atherosclerosis, without evidence of aneurysm or dissection in the abdominal or pelvic vasculature. No lymphadenopathy noted in the abdomen or pelvis. Reproductive: Uterus and ovaries are unremarkable in appearance.  Other: No significant volume of ascites.  No pneumoperitoneum. Musculoskeletal: In the left ilium adjacent to the left sacroiliac joint there is an expansile lytic lesion which has internal soft tissue and has disrupted the overlying cortex in several places, most notably laterally (axial image 67 of series 2) overall measuring 5.4 x 2.4 cm. No other definite osseous lesions are identified. IMPRESSION: 1. 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 Whether this represents a primary lesion or metastatic lesion is uncertain. Consideration for biopsy is recommended. No other osseous lesions are identified. 2. No definite finding to suggest a primary malignancy in the abdomen identified on today's examination. 3. Aortic atherosclerosis. 4. Additional incidental findings, as above. Electronically Signed   By: Trudie Reed M.D.   On: 10/05/2019 07:41  Labs:  CBC: Recent Labs    10/18/19 0933  WBC 11.5*  HGB 11.2*  HCT 36.2  PLT 335    COAGS: Recent Labs    10/18/19 0933  INR 1.2    BMP: No results for input(s): NA, K, CL, CO2, GLUCOSE, BUN, CALCIUM, CREATININE, GFRNONAA, GFRAA in the last 8760 hours.  Invalid input(s): CMP  LIVER FUNCTION TESTS: No results for input(s): BILITOT, AST, ALT, ALKPHOS, PROT, ALBUMIN in the last 8760 hours.  TUMOR MARKERS: No results for input(s): AFPTM, CEA, CA199, CHROMGRNA in the last 8760 hours.  Assessment and Plan:  Left iliac bone lesion Scheduled for biopsy today Risks and benefits of left iliac bone lesion biopsy was discussed with the patient and/or patient's family including, but not limited to bleeding, infection, damage to adjacent structures or low yield requiring additional tests.  All of the questions were answered and there is agreement to proceed. Consent signed and in chart.   Thank you for this interesting consult.  I greatly enjoyed meeting Annette Wade and look forward to participating in their care.  A copy  of this report was sent to the requesting provider on this date.  Electronically Signed: Robet Leu, PA-C 10/18/2019, 10:09 AM   I spent a total of  30 Minutes   in face to face in clinical consultation, greater than 50% of which was counseling/coordinating care for left iliac bone lesion bx

## 2019-10-18 NOTE — Procedures (Signed)
Interventional Radiology Procedure Note  Procedure: Ct bx left iliac lytic lesion  Complications: None  Estimated Blood Loss: min  Findings: 18 g cores

## 2019-10-18 NOTE — Progress Notes (Signed)
Discharge instructions reviewed with pt and her daughter (via telephone) both voice understanding.  

## 2019-10-18 NOTE — Discharge Instructions (Addendum)
Needle Biopsy, Care After These instructions tell you how to care for yourself after your procedure. Your doctor may also give you more specific instructions. Call your doctor if you have any problems or questions. What can I expect after the procedure? After the procedure, it is common to have:  Soreness.  Bruising.  Mild pain. Follow these instructions at home:   Return to your normal activities as told by your doctor. Ask your doctor what activities are safe for you.  Take over-the-counter and prescription medicines only as told by your doctor.  Wash your hands with soap and water before you change your bandage (dressing). If you cannot use soap and water, use hand sanitizer.  Follow instructions from your doctor about: ? How to take care of your puncture site. ? When and how to change your bandage. ? When to remove your bandage.  Check your puncture site every day for signs of infection. Watch for: ? Redness, swelling, or pain. ? Fluid or blood. ? Pus or a bad smell. ? Warmth.  Do not take baths, swim, or use a hot tub until your doctor approves. Ask your doctor if you may take showers. You may only be allowed to take sponge baths.  Keep all follow-up visits as told by your doctor. This is important. Contact a doctor if you have:  A fever.  Redness, swelling, or pain at the puncture site, and it lasts longer than a few days.  Fluid, blood, or pus coming from the puncture site.  Warmth coming from the puncture site. Get help right away if:  You have a lot of bleeding from the puncture site. Summary  After the procedure, it is common to have soreness, bruising, or mild pain at the puncture site.  Check your puncture site every day for signs of infection, such as redness, swelling, or pain.  Get help right away if you have severe bleeding from your puncture site. This information is not intended to replace advice given to you by your health care provider. Make  sure you discuss any questions you have with your health care provider. Document Revised: 05/10/2017 Document Reviewed: 05/10/2017 Elsevier Patient Education  2020 Elsevier Inc. Moderate Conscious Sedation, Adult Sedation is the use of medicines to promote relaxation and relieve discomfort and anxiety. Moderate conscious sedation is a type of sedation. Under moderate conscious sedation, you are less alert than normal, but you are still able to respond to instructions, touch, or both. Moderate conscious sedation is used during short medical and dental procedures. It is milder than deep sedation, which is a type of sedation under which you cannot be easily woken up. It is also milder than general anesthesia, which is the use of medicines to make you unconscious. Moderate conscious sedation allows you to return to your regular activities sooner. Tell a health care provider about:  Any allergies you have.  All medicines you are taking, including vitamins, herbs, eye drops, creams, and over-the-counter medicines.  Use of steroids (by mouth or creams).  Any problems you or family members have had with sedatives and anesthetic medicines.  Any blood disorders you have.  Any surgeries you have had.  Any medical conditions you have, such as sleep apnea.  Whether you are pregnant or may be pregnant.  Any use of cigarettes, alcohol, marijuana, or street drugs. What are the risks? Generally, this is a safe procedure. However, problems may occur, including:  Getting too much medicine (oversedation).  Nausea.  Allergic reaction to   medicines.  Trouble breathing. If this happens, a breathing tube may be used to help with breathing. It will be removed when you are awake and breathing on your own.  Heart trouble.  Lung trouble. What happens before the procedure? Staying hydrated Follow instructions from your health care provider about hydration, which may include:  Up to 2 hours before the  procedure - you may continue to drink clear liquids, such as water, clear fruit juice, black coffee, and plain tea. Eating and drinking restrictions Follow instructions from your health care provider about eating and drinking, which may include:  8 hours before the procedure - stop eating heavy meals or foods such as meat, fried foods, or fatty foods.  6 hours before the procedure - stop eating light meals or foods, such as toast or cereal.  6 hours before the procedure - stop drinking milk or drinks that contain milk.  2 hours before the procedure - stop drinking clear liquids. Medicine Ask your health care provider about:  Changing or stopping your regular medicines. This is especially important if you are taking diabetes medicines or blood thinners.  Taking medicines such as aspirin and ibuprofen. These medicines can thin your blood. Do not take these medicines before your procedure if your health care provider instructs you not to.  Tests and exams  You will have a physical exam.  You may have blood tests done to show: ? How well your kidneys and liver are working. ? How well your blood can clot. General instructions  Plan to have someone take you home from the hospital or clinic.  If you will be going home right after the procedure, plan to have someone with you for 24 hours. What happens during the procedure?  An IV tube will be inserted into one of your veins.  Medicine to help you relax (sedative) will be given through the IV tube.  The medical or dental procedure will be performed. What happens after the procedure?  Your blood pressure, heart rate, breathing rate, and blood oxygen level will be monitored often until the medicines you were given have worn off.  Do not drive for 24 hours. This information is not intended to replace advice given to you by your health care provider. Make sure you discuss any questions you have with your health care provider. Document  Revised: 04/09/2017 Document Reviewed: 08/17/2015 Elsevier Patient Education  2020 Elsevier Inc.  

## 2019-10-23 LAB — SURGICAL PATHOLOGY

## 2021-03-15 IMAGING — CT CT ABD-PELV W/ CM
1 of 3 series · 13 of 32 positions shown, 18 images · IV contrast (APPLIED)
Comparison: No priors.

CLINICAL DATA: 68-year-old female with history of osseous lesions
of the ileum. Evaluate for potential metastatic disease.

EXAM:
CT ABDOMEN AND PELVIS WITH CONTRAST
TECHNIQUE: Multidetector CT imaging of the abdomen and pelvis was performed
using the standard protocol following bolus administration of
intravenous contrast.
CONTRAST:  100mL [id number] IOPAMIDOL (TL974W-QTT) INJECTION 61%

[Series 2: abd/pelvis w/cm · axial · 0.87mm/px · z∈[+540,+950]mm · 13 of 94 slices shown, 18 images]
[im 6/94  soft-tissue]
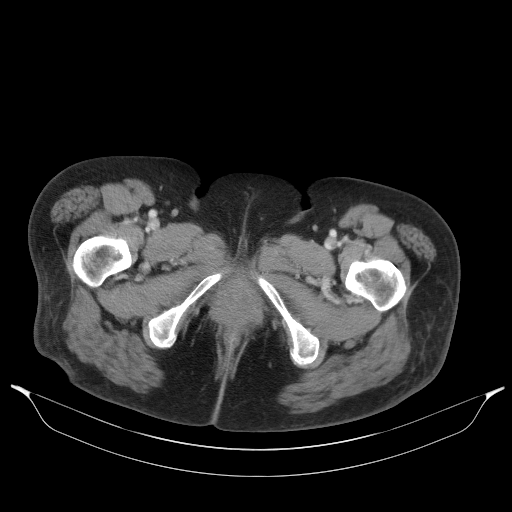
[im 6/94  bone]
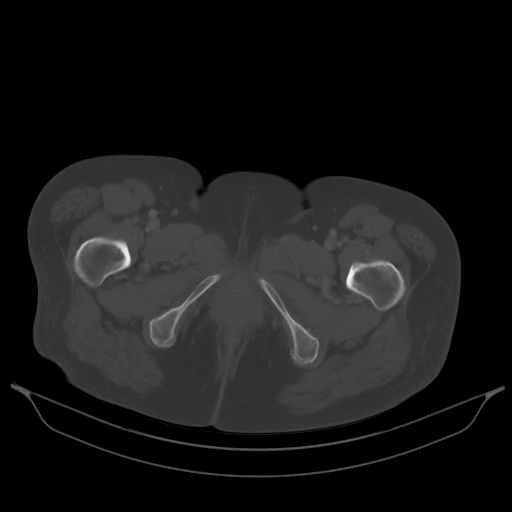
[im 17/94  soft-tissue]
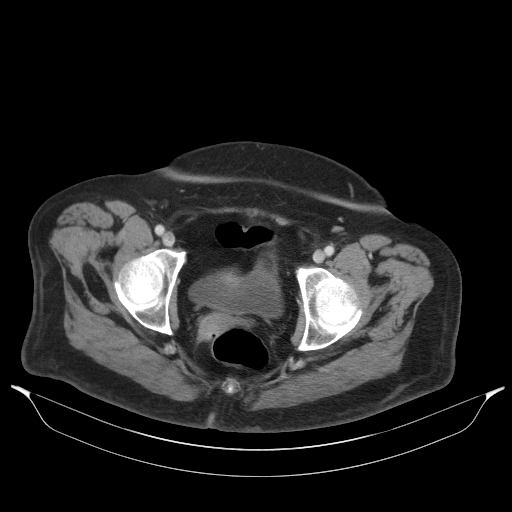
[im 22/94  soft-tissue]
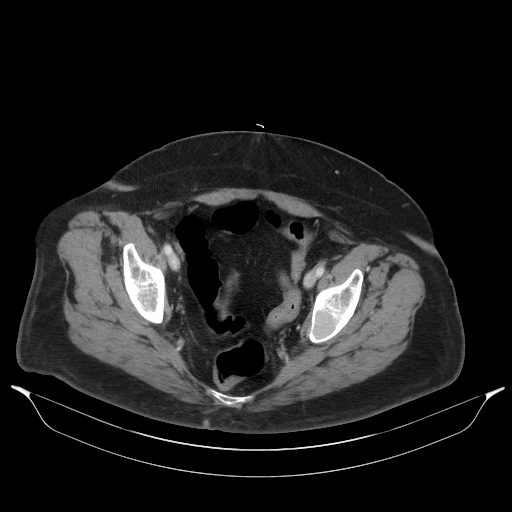
[im 28/94  soft-tissue]
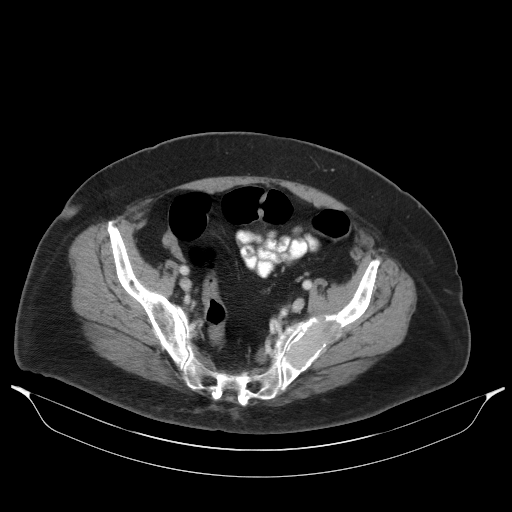
[im 39/94  soft-tissue]
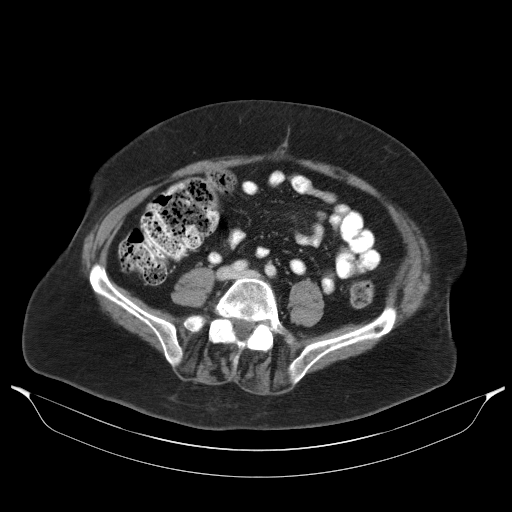
[im 44/94  soft-tissue]
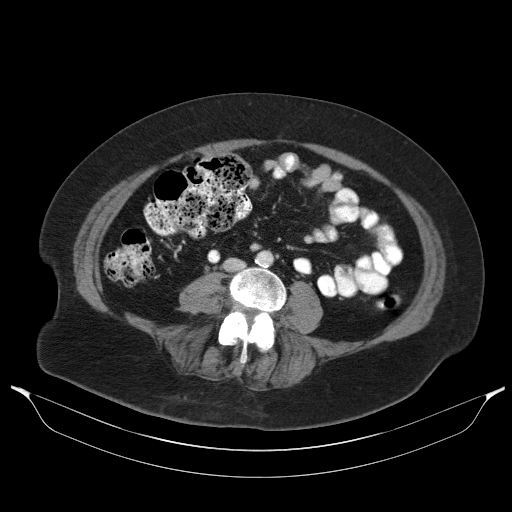
[im 50/94  soft-tissue]
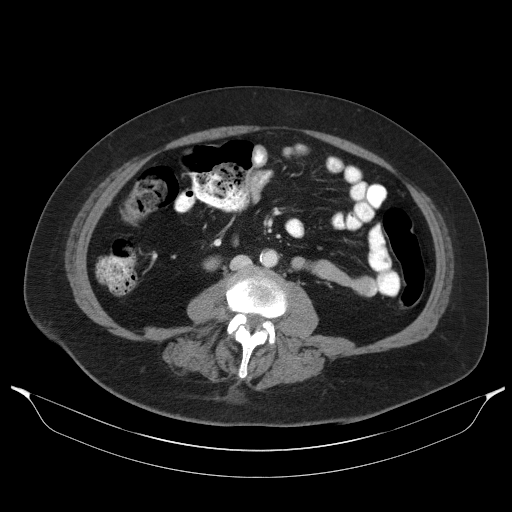
[im 61/94  soft-tissue]
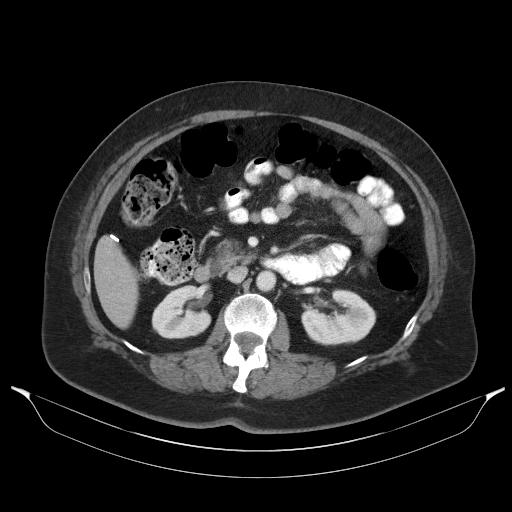
[im 66/94  soft-tissue]
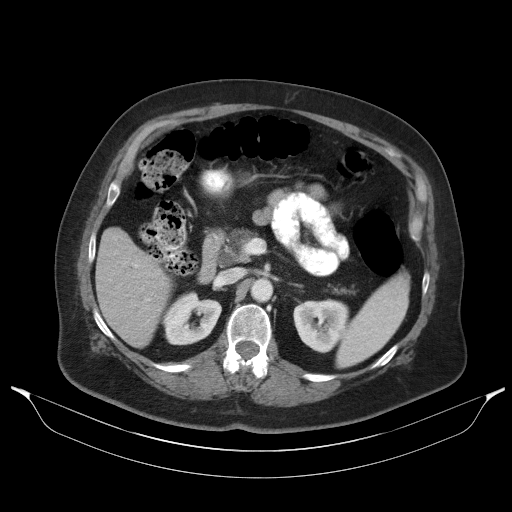
[im 66/94  bone]
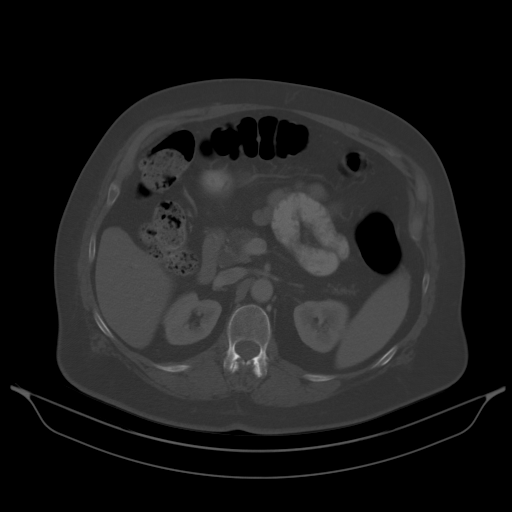
[im 72/94  soft-tissue]
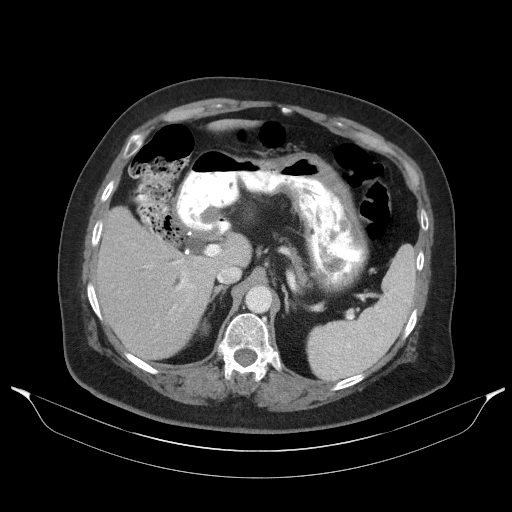
[im 72/94  lung]
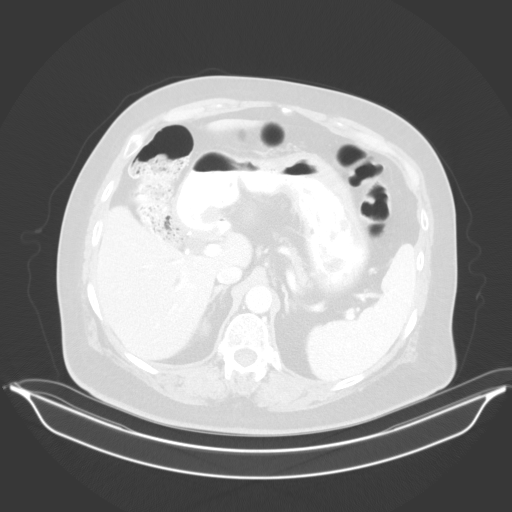
[im 77/94  lung]
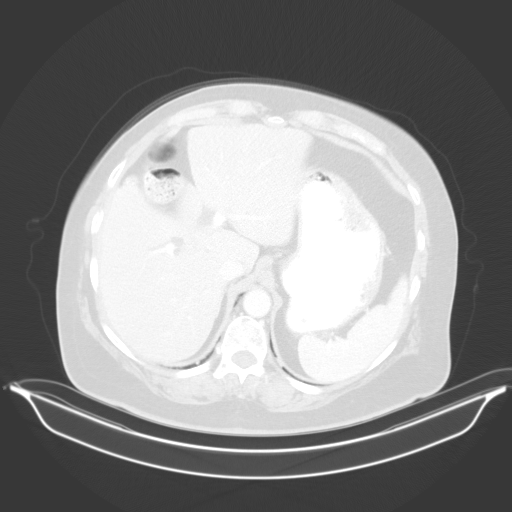
[im 83/94  soft-tissue]
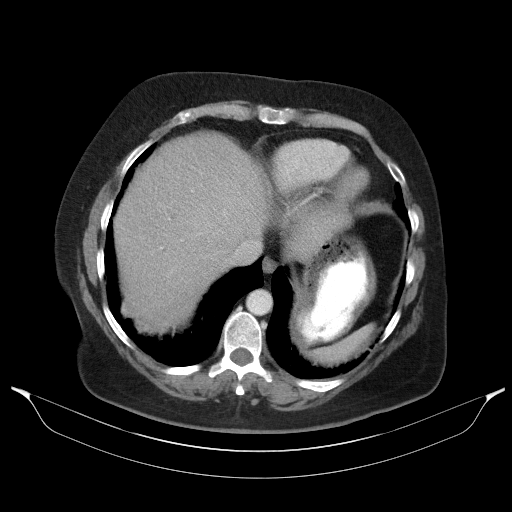
[im 83/94  lung]
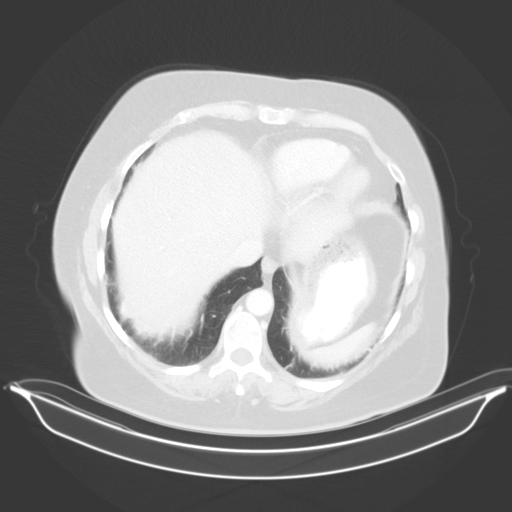
[im 88/94  soft-tissue]
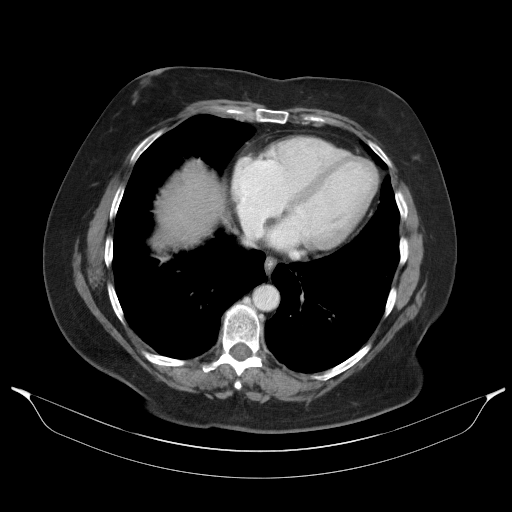
[im 88/94  lung]
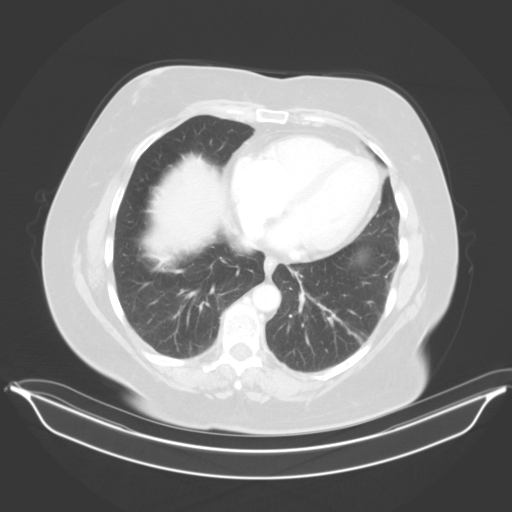

[13 of 32 positions shown; findings below may reference images not displayed]

FINDINGS: Lower chest: Linear areas of scarring and/or atelectasis in the
lower lungs bilaterally.

Hepatobiliary: No suspicious cystic or solid hepatic lesions. Status
post cholecystectomy. Mild intra and extrahepatic biliary ductal
dilatation, likely reflective of benign post cholecystectomy
physiology.

Pancreas: No pancreatic mass. No pancreatic ductal dilatation. No
pancreatic or peripancreatic fluid collections or inflammatory
changes.

Spleen: Unremarkable.

Adrenals/Urinary Tract: Subcentimeter low-attenuation lesions in
both kidneys, too small to definitively characterize, but
statistically likely to represent tiny cysts. Bilateral adrenal
glands are normal in appearance. No hydroureteronephrosis. Urinary
bladder is normal in appearance.

Stomach/Bowel: Normal appearance of the stomach. No pathologic
dilatation of small bowel or colon. The appendix is not confidently
identified and may be surgically absent. Regardless, there are no
inflammatory changes noted adjacent to the cecum to suggest the
presence of an acute appendicitis at this time.

Vascular/Lymphatic: Aortic atherosclerosis, without evidence of
aneurysm or dissection in the abdominal or pelvic vasculature. No
lymphadenopathy noted in the abdomen or pelvis.

Reproductive: Uterus and ovaries are unremarkable in appearance.

Other: No significant volume of ascites.  No pneumoperitoneum.

Musculoskeletal: In the left ilium adjacent to the left sacroiliac
joint there is an expansile lytic lesion which has internal soft
tissue and has disrupted the overlying cortex in several places,
most notably laterally (axial image 67 of series 2) overall
measuring 5.4 x 2.4 cm. No other definite osseous lesions are
identified.
IMPRESSION: 1. Large malignant appearing lytic lesion in the left ilium. Whether
this represents a primary lesion or metastatic lesion is uncertain.
Consideration for biopsy is recommended. No other osseous lesions
are identified.
2. No definite finding to suggest a primary malignancy in the
abdomen identified on today's examination.
3. Aortic atherosclerosis.
4. Additional incidental findings, as above.

## 2023-04-29 ENCOUNTER — Ambulatory Visit: Payer: Medicare HMO | Admitting: Allergy

## 2023-04-29 ENCOUNTER — Encounter: Payer: Self-pay | Admitting: Allergy

## 2023-04-29 ENCOUNTER — Telehealth: Payer: Self-pay

## 2023-04-29 VITALS — BP 124/82 | HR 75 | Temp 97.9°F | Resp 18 | Ht 64.0 in | Wt 163.5 lb

## 2023-04-29 DIAGNOSIS — H6993 Unspecified Eustachian tube disorder, bilateral: Secondary | ICD-10-CM

## 2023-04-29 DIAGNOSIS — J4520 Mild intermittent asthma, uncomplicated: Secondary | ICD-10-CM | POA: Diagnosis not present

## 2023-04-29 DIAGNOSIS — J3089 Other allergic rhinitis: Secondary | ICD-10-CM

## 2023-04-29 MED ORDER — AZELASTINE HCL 0.1 % NA SOLN: 1.0000 | Freq: Two times a day (BID) | NASAL | 3 refills | Status: DC | PRN

## 2023-04-29 MED ORDER — ALBUTEROL SULFATE HFA 108 (90 BASE) MCG/ACT IN AERS: 2.0000 | INHALATION_SPRAY | RESPIRATORY_TRACT | 1 refills | Status: AC | PRN

## 2023-04-29 MED ORDER — MONTELUKAST SODIUM 10 MG PO TABS: 10.0000 mg | ORAL_TABLET | Freq: Every day | ORAL | 3 refills | Status: DC

## 2023-04-29 NOTE — Patient Instructions (Addendum)
Environmental allergies Start environmental control measures as below. Stop zyrtec as ineffective.  Start Singulair (montelukast) 10mg  daily at night. Cautioned that in some children/adults can experience behavioral changes including hyperactivity, agitation, depression, sleep disturbances and suicidal ideations. These side effects are rare, but if you notice them you should notify me and discontinue Singulair (montelukast). Use azelastine nasal spray 1-2 sprays per nostril twice a day as needed for runny nose/drainage. Use Flonase (fluticasone) nasal spray 1-2 sprays per nostril once a day as needed for nasal congestion.  Nasal saline spray (i.e., Simply Saline) or nasal saline lavage (i.e., NeilMed) is recommended as needed and prior to medicated nasal sprays. Get bloodwork.   Eustachian tube dysfunction Recommend ENT evaluation.  Asthma Breathing test unremarkable.  Monitor symptoms. May use albuterol rescue inhaler 2 puffs every 4 to 6 hours as needed for shortness of breath, chest tightness, coughing, and wheezing.  Monitor frequency of use - if you need to use it more than twice per week on a consistent basis let us know.   Follow up in 2 months or sooner if needed.   Mold Control Mold and fungi can grow on a variety of surfaces provided certain temperature and moisture conditions exist.  Outdoor molds grow on plants, decaying vegetation and soil. The major outdoor mold, Alternaria and Cladosporium, are found in very high numbers during hot and dry conditions. Generally, a late summer - fall peak is seen for common outdoor fungal spores. Rain will temporarily lower outdoor mold spore count, but counts rise rapidly when the rainy period ends. The most important indoor molds are Aspergillus and Penicillium. Dark, humid and poorly ventilated basements are ideal sites for mold growth. The next most common sites of mold growth are the bathroom and the kitchen. Outdoor (Seasonal) Mold  Control Use air conditioning and keep windows closed. Avoid exposure to decaying vegetation. Avoid leaf raking. Avoid grain handling. Consider wearing a face mask if working in moldy areas.  Indoor (Perennial) Mold Control  Maintain humidity below 50%. Get rid of mold growth on hard surfaces with water, detergent and, if necessary, 5% bleach (do not mix with other cleaners). Then dry the area completely. If mold covers an area more than 10 square feet, consider hiring an indoor environmental professional. For clothing, washing with soap and water is best. If moldy items cannot be cleaned and dried, throw them away. Remove sources e.g. contaminated carpets. Repair and seal leaking roofs or pipes. Using dehumidifiers in damp basements may be helpful, but empty the water and clean units regularly to prevent mildew from forming. All rooms, especially basements, bathrooms and kitchens, require ventilation and cleaning to deter mold and mildew growth. Avoid carpeting on concrete or damp floors, and storing items in damp areas.

## 2023-04-29 NOTE — Telephone Encounter (Signed)
Please refer to ent for Eustachian tube dysfunction

## 2023-04-29 NOTE — Progress Notes (Signed)
New Patient Note  RE: [place] MRN: [id number] DOB: [birth year] Date of Office Visit: 04/29/2023  Consult requested by: Bernerd Pho* Primary care provider: Bailey Mech, PA-C  Chief Complaint: Allergic Rhinitis  (Runny nose, cough, ears stopped up, hoarse at times )  History of Present Illness: I had the pleasure of seeing Annette Wade for initial evaluation at the [facility] on 04/29/2023. She is a 72 y.o. female, who is referred here by Bailey Mech, PA-C for the evaluation of reestablishing care.  Last office visit was on 06/09/2019 with Dr. Selena Batten for asthma, allergic rhinitis and GERD.  Discussed the use of AI scribe software for clinical note transcription with the patient, who gave verbal consent to proceed.  The patient, diagnosed with multiple myeloma three years ago, underwent a bone marrow transplant at [facility].   Recently, the patient has been experiencing hoarseness, cough, and rhinorrhea. She sought care from her primary a month ago for pain in the forehead and below the eyes, suspecting a sinus infection. She was prescribed Augmentin for a week, but the treatment did not alleviate the symptoms. The patient has a history of allergies and asthma but reports no recent exacerbations of asthma. She has been using Flonase and Zyrtec for allergies, but does not find the Zyrtec particularly effective.  The patient also reports ear congestion, more pronounced in the right ear. She has not seen an ear, nose, and throat specialist recently. She has not been using her Symbicort inhaler or Singulair (montelukast) for asthma for an extended period, possibly over a year.  The patient is also on medication for heartburn or reflux (omeprazole) and reports no known food allergies. She has been experiencing dry skin, which she attributes to her medication, and has been drinking a lot of water to combat this side effect.      Assessment and Plan: Annette is a 72 y.o. female with: Other allergic rhinitis Hoarseness, cough, and rhinorrhea ongoing for a month. Failed Augmentin for presumed sinusitis. Zyrtec ineffective. 2018 skin testing was positive to mold.  Start environmental control measures as below. Stop zyrtec as ineffective.  Start Singulair (montelukast) 10mg  daily at night. Cautioned that in some children/adults can experience behavioral changes including hyperactivity, agitation, depression, sleep disturbances and suicidal ideations. These side effects are rare, but if you notice them you should notify me and discontinue Singulair (montelukast). Use azelastine nasal spray 1-2 sprays per nostril twice a day as needed for runny nose/drainage. Use Flonase (fluticasone) nasal spray 1-2 sprays per nostril once a day as needed for nasal congestion.  Nasal saline spray (i.e., Simply Saline) or nasal saline lavage (i.e., NeilMed) is recommended as needed and prior to medicated nasal sprays. Get bloodwork.   Eustachian tube dysfunction, bilateral Referral to ENT placed.  Mild intermittent asthma without complication No recent symptoms. Not currently using inhaler or Singulair. Today's spirometry was unremarkable.  Monitor symptoms. May use albuterol rescue inhaler 2 puffs every 4 to 6 hours as needed for shortness of breath, chest tightness, coughing, and wheezing.  Monitor frequency of use - if you need to use it more than twice per week on a consistent basis let us know.   Return in about 2 months (around 06/30/2023).  Meds ordered this encounter  Medications   azelastine (ASTELIN) 0.1 % nasal spray    Sig: Place 1-2 sprays into both nostrils 2 (two) times daily as needed (nasal drainage). Use in each nostril as directed  Dispense:  30 mL    Refill:  3   montelukast (SINGULAIR) 10 MG tablet    Sig: Take 1 tablet (10 mg total) by mouth at bedtime.    Dispense:  30 tablet    Refill:  3   albuterol  (VENTOLIN HFA) 108 (90 Base) MCG/ACT inhaler    Sig: Inhale 2 puffs into the lungs every 4 (four) hours as needed for wheezing or shortness of breath (coughing fits).    Dispense:  18 g    Refill:  1   Lab Orders         Allergens w/Total IgE Area 2      Other allergy screening: Food allergy: no Medication allergy: yes Hymenoptera allergy: no Urticaria: no Eczema:no History of recurrent infections suggestive of immunodeficency: no  Diagnostics: Spirometry:  Tracings reviewed. Her effort: It was hard to get consistent efforts and there is a question as to whether this reflects a maximal maneuver. FVC: 2.58L FEV1: 1.96L, 91% predicted FEV1/FVC ratio: 76% Interpretation: No overt abnormalities noted given today's efforts.  Please see scanned spirometry results for details.  Results discussed with patient/family.   Past Medical History: Patient Active Problem List   Diagnosis Date Noted   Cough, persistent 08/03/2018   Serum gamma globulin increased 08/13/2017   Renal insufficiency 08/06/2017   Hyperlipidemia associated with type 2 diabetes mellitus (HCC) 08/04/2017   Moderate persistent asthma with acute exacerbation 02/23/2017   Viral upper respiratory tract infection 02/23/2017   Nuclear sclerotic cataract of left eye 02/16/2017   Cortical age-related cataract of both eyes 02/15/2017   Nuclear sclerotic cataract of both eyes 02/15/2017   Myopia with presbyopia, bilateral 02/09/2017   Moderate persistent asthma 11/19/2016   Acute maxillary sinusitis 11/19/2016   IFG (impaired fasting glucose) 10/26/2016   Controlled type 2 diabetes mellitus without complication, without long-term current use of insulin (HCC) 10/26/2016   Abnormal liver function test 03/12/2016   Colon cancer screening 03/12/2016   Fatty liver 03/12/2016   Gastroesophageal reflux disease without esophagitis 03/12/2016   Nausea 03/12/2016   Other allergic rhinitis 06/27/2015   Erosive gastritis  09/26/2014   Degenerative spondylolisthesis 06/08/2014   Lumbar degenerative disc disease 06/08/2014   Sciatica of left side 04/24/2014   Vitamin D deficiency 02/19/2014   Chronic fatigue and malaise 09/26/2013   Anxiety state 07/14/2013   Depression 07/14/2013   Episodic headache 07/14/2013   Essential tremor 07/14/2013   Migraine without status migrainosus, not intractable 07/14/2013   Mild intermittent asthma 07/14/2013   Current episode of major depressive disorder without prior episode 07/14/2013   Hypertension associated with diabetes (HCC) 10/15/2012   Hypothyroidism, adult 10/15/2012   Past Medical History:  Diagnosis Date   Allergic rhinitis    Asthma    Hypertension    Hypothyroid    Past Surgical History: Past Surgical History:  Procedure Laterality Date   ADENOIDECTOMY     BASAL CELL CARCINOMA EXCISION     CESAREAN SECTION     CHOLECYSTECTOMY     SINOSCOPY     TONSILLECTOMY     Medication List:  Current Outpatient Medications  Medication Sig Dispense Refill   albuterol (VENTOLIN HFA) 108 (90 Base) MCG/ACT inhaler Inhale 2 puffs into the lungs every 4 (four) hours as needed for wheezing or shortness of breath (coughing fits). 18 g 1   aspirin EC 81 MG tablet Take by mouth.     atorvastatin (LIPITOR) 10 MG tablet Take 10 mg by mouth daily at  6 PM.      azelastine (ASTELIN) 0.1 % nasal spray Place 1-2 sprays into both nostrils 2 (two) times daily as needed (nasal drainage). Use in each nostril as directed 30 mL 3   Calcium Carbonate-Vit D-Min (CALCIUM 1200 PO) Take 1 tablet by mouth daily.      Chelated Magnesium 100 MG TABS Take 1 tablet by mouth 2 (two) times daily.     cholestyramine (QUESTRAN) 4 g packet Take by mouth.     clonazePAM (KLONOPIN) 1 MG tablet Take 1 mg by mouth 3 (three) times daily as needed for anxiety.      diphenoxylate-atropine (LOMOTIL) 2.5-0.025 MG tablet TAKE 1 TABLET BY MOUTH 4 TIMES DAILY AS NEEDED FOR DIARRHEA     DULoxetine  (CYMBALTA) 60 MG capsule Take 120 mg by mouth daily.      fluticasone (FLONASE) 50 MCG/ACT nasal spray Place into the nose.     gabapentin (NEURONTIN) 100 MG capsule Take by mouth.     levothyroxine (SYNTHROID, LEVOTHROID) 75 MCG tablet Take 75 mcg by mouth daily before breakfast.      loperamide (IMODIUM) 2 MG capsule TAKE 1 CAPSULE BY MOUTH 4 TIMES DAILY AS NEEDED FOR DIARRHEA     meclizine (ANTIVERT) 25 MG tablet Take by mouth.     montelukast (SINGULAIR) 10 MG tablet Take 1 tablet (10 mg total) by mouth at bedtime. 30 tablet 3   Multiple Vitamin (MULTI-VITAMINS) TABS Take 1 tablet by mouth daily.      omeprazole (PRILOSEC) 20 MG capsule Take 1 capsule (20 mg total) by mouth daily. 90 capsule 1   ondansetron (ZOFRAN) 8 MG tablet TAKE 1 TABLET BY MOUTH EVERY 6 HOURS AS NEEDED FOR NAUSEA FOR VOMITING     prochlorperazine (COMPAZINE) 10 MG tablet Take 1 tablet by mouth every 6 (six) hours as needed.     promethazine (PHENERGAN) 25 MG tablet Take 25 mg by mouth every 8 (eight) hours as needed for nausea or vomiting.      REVLIMID 10 MG capsule      traZODone (DESYREL) 50 MG tablet Take 100 mg by mouth at bedtime as needed for sleep.      Vitamin D, Ergocalciferol, (DRISDOL) 1.25 MG (50000 UNIT) CAPS capsule Take 1 capsule by mouth once a week.     No current facility-administered medications for this visit.   Allergies: Allergies  Allergen Reactions   Lisinopril Nausea Only and Other (See Comments)    Typical ACE Inhibitor Cough cough   Moxifloxacin Nausea Only and Hives    Other reaction(s): HIVES   Doxycycline Nausea Only   Codeine Nausea And Vomiting   Oxycodone-Acetaminophen Itching   Prednisone Anxiety   Social History: Social History   Socioeconomic History   Marital status: Single    Spouse name: Not on file   Number of children: Not on file   Years of education: Not on file   Highest education level: Not on file  Occupational History   Not on file  Tobacco Use    Smoking status: Never   Smokeless tobacco: Never  Vaping Use   Vaping status: Never Used  Substance and Sexual Activity   Alcohol use: Yes    Alcohol/week: 1.0 standard drink of alcohol    Types: 1 Glasses of wine per week    Comment: occasional glass of wine   Drug use: No   Sexual activity: Not Currently  Other Topics Concern   Not on file  Social History Narrative  Not on file   Social Drivers of Health   Financial Resource Strain: High Risk (03/12/2022)   Received from [facility], [facility] visits prior to 07/11/2022.   Overall Financial Resource Strain (CARDIA)    Difficulty of Paying Living Expenses: Hard  Food Insecurity: Low Risk  (03/15/2023)   Received from [facility]   Hunger Vital Sign    Worried About Running Out of Food in the Last Year: Never true    Ran Out of Food in the Last Year: Never true  Transportation Needs: No Transportation Needs (03/15/2023)   Received from [employer]    In the past 12 months, has lack of reliable transportation kept you from medical appointments, meetings, work or from getting things needed for daily living? : No  Physical Activity: Insufficiently Active (03/12/2022)   Received from [facility], [facility] visits prior to 07/11/2022.   Exercise Vital Sign    Days of Exercise per Week: 3 days    Minutes of Exercise per Session: 20 min  Stress: No Stress Concern Present (03/12/2022)   Received from [facility], [facility] visits prior to 07/11/2022.   [employer] of Occupational Health - Occupational Stress Questionnaire    Feeling of Stress : Only a little  Social Connections: Unknown (03/12/2022)   Received from [facility], [facility] visits prior to 07/11/2022.   Social Advertising account executive [NHANES]    Frequency of Communication with Friends and Family: More than three times a week    Frequency of  Social Gatherings with Friends and Family: Patient declined    Attends Religious Services: Patient declined    Database administrator or Organizations: Yes    Attends Banker Meetings: Patient declined    Marital Status: Divorced   Lives in an apartment. Smoking: denies. Occupation: retired.  Environmental History: Water Damage/mildew in the house: no Carpet in the family room: yes Carpet in the bedroom: yes Heating: heat pump Cooling: central Pet: no  Family History: Family History  Problem Relation Age of Onset   Asthma Daughter    Allergic rhinitis Neg Hx    Angioedema Neg Hx    Eczema Neg Hx    Immunodeficiency Neg Hx    Urticaria Neg Hx    Review of Systems  Constitutional:  Negative for appetite change, chills, fever and unexpected weight change.  HENT:  Positive for postnasal drip, rhinorrhea and voice change. Negative for congestion.   Eyes:  Negative for itching.  Respiratory:  Positive for cough. Negative for chest tightness, shortness of breath and wheezing.   Cardiovascular:  Negative for chest pain.  Gastrointestinal:  Negative for abdominal pain.  Genitourinary:  Negative for difficulty urinating.  Skin:  Negative for rash.  Allergic/Immunologic: Positive for environmental allergies.  Neurological:  Negative for headaches.    Objective: BP 124/82   Pulse 75   Temp 97.9 F (36.6 C)   Resp 18   Ht 5\' 4"  (1.626 m)   Wt 163 lb 8 oz (74.2 kg)   SpO2 95%   BMI 28.06 kg/m  Body mass index is 28.06 kg/m. Physical Exam Vitals and nursing note reviewed.  Constitutional:      Appearance: Normal appearance. She is well-developed.  HENT:     Head: Normocephalic and atraumatic.     Right Ear: Tympanic membrane and external ear normal.     Left Ear:  Tympanic membrane and external ear normal.     Nose: Nose normal.     Mouth/Throat:     Mouth: Mucous membranes are dry.     Pharynx: Oropharynx is clear.  Eyes:     Conjunctiva/sclera:  Conjunctivae normal.  Cardiovascular:     Rate and Rhythm: Normal rate and regular rhythm.     Heart sounds: Normal heart sounds. No murmur heard.    No friction rub. No gallop.  Pulmonary:     Effort: Pulmonary effort is normal.     Breath sounds: Normal breath sounds. No wheezing, rhonchi or rales.  Musculoskeletal:     Cervical back: Neck supple.  Skin:    General: Skin is warm.     Findings: No rash.  Neurological:     Mental Status: She is alert and oriented to person, place, and time.  Psychiatric:        Behavior: Behavior normal.   The plan was reviewed with the patient/family, and all questions/concerned were addressed.  It was my pleasure to see Annette today and participate in her care. Please feel free to contact me with any questions or concerns.  Sincerely,  Wyline Mood, DO Allergy & Immunology  [facility] office: [phone] [facility] office: [phone]

## 2023-05-14 NOTE — Telephone Encounter (Signed)
 Annette Wade  has been referred to   [facility] [address] [place],  [place]  [address]  Main: [phone] Fax: [phone]  I tried to place this referral internally through EPIC, however, the referral keeps automatically changing it to incoming.  I faxed all correspondence to their office.  I sent [name]  a MyChart message informing her of this referral.

## 2023-05-14 NOTE — Telephone Encounter (Signed)
 Please place referral to [facility] Specialist via Epic.

## 2023-05-19 ENCOUNTER — Ambulatory Visit (INDEPENDENT_AMBULATORY_CARE_PROVIDER_SITE_OTHER): Payer: Medicare HMO | Admitting: Internal Medicine

## 2023-05-19 ENCOUNTER — Ambulatory Visit (HOSPITAL_BASED_OUTPATIENT_CLINIC_OR_DEPARTMENT_OTHER)
Admission: RE | Admit: 2023-05-19 | Discharge: 2023-05-19 | Disposition: A | Discharge status: Home / Self Care | Payer: Self-pay | Source: Ambulatory Visit | Attending: Internal Medicine | Admitting: Internal Medicine

## 2023-05-19 ENCOUNTER — Other Ambulatory Visit: Payer: Self-pay

## 2023-05-19 ENCOUNTER — Encounter (INDEPENDENT_AMBULATORY_CARE_PROVIDER_SITE_OTHER): Payer: Self-pay | Admitting: Otolaryngology

## 2023-05-19 ENCOUNTER — Encounter: Payer: Self-pay | Admitting: Internal Medicine

## 2023-05-19 VITALS — BP 116/70 | HR 78 | Temp 98.2°F | Resp 16 | Ht 64.0 in | Wt 161.0 lb

## 2023-05-19 DIAGNOSIS — J0110 Acute frontal sinusitis, unspecified: Secondary | ICD-10-CM | POA: Diagnosis not present

## 2023-05-19 DIAGNOSIS — R052 Subacute cough: Secondary | ICD-10-CM

## 2023-05-19 DIAGNOSIS — J4520 Mild intermittent asthma, uncomplicated: Secondary | ICD-10-CM

## 2023-05-19 DIAGNOSIS — K219 Gastro-esophageal reflux disease without esophagitis: Secondary | ICD-10-CM

## 2023-05-19 MED ORDER — PREDNISONE 20 MG PO TABS: 20.0000 mg | ORAL_TABLET | Freq: Every day | ORAL | 0 refills | Status: AC

## 2023-05-19 MED ORDER — SULFAMETHOXAZOLE-TRIMETHOPRIM 800-160 MG PO TABS: 1.0000 | ORAL_TABLET | Freq: Two times a day (BID) | ORAL | 0 refills | Status: AC

## 2023-05-19 MED ORDER — BUDESONIDE-FORMOTEROL FUMARATE 80-4.5 MCG/ACT IN AERO: 2.0000 | INHALATION_SPRAY | Freq: Two times a day (BID) | RESPIRATORY_TRACT | 3 refills | Status: AC | PRN

## 2023-05-19 NOTE — Progress Notes (Signed)
 Please call Annette Wade and let her know that her CXR was read as normal.  Let's continue the plan to treat for sinusitis and see her back in a few weeks to make sure she is controlled.  Let me know if she has questions or concerns.

## 2023-05-19 NOTE — Patient Instructions (Addendum)
 Chronic Cough Persistent cough for 3-4 weeks, worsening at night, unresponsive to albuterol  or Singulair . No associated wheezing or shortness of breath. Previous PFTs were normal. Hx of multiple myeloma s/p BMT with bone mets. -Order chest X-ray STAT given history of bone mets. -Breathing test today looks good, not responding to albuterol  but history of asthma -Notify oncologist about persistent cough.  Possible Sinusitis Complaints of pain and pressure above and below eyes, postnasal drainage, and previous treatment with Augmentin  for 7 days without improvement. -Start Bactrim  for 14 days. - Start prednisone  20 mg daily for 5 days. -If no improvement, consider CT scan of sinuses.  Gastroesophageal Reflux Disease (GERD) Currently controlled on Omeprazole , taken before meals, with additional dose as needed at night. -Continue current regimen.   Asthma History of asthma, currently on Singulair . No current wheezing reported. -Breathing test today looks good, not responding to albuterol  but history of asthma - continue singulair  10 mg daily for asthma/allergies. - in place of albuterol , use Symbicort  80 mcg 2-4 puffs every 4 hours as needed and can use during asthma flares/respiratory illness: At onset of respiratory illness/asthma flare: Inhale 2 puffs twice daily with spacer for 2 weeks or until symptoms resolve. Maximum 12 puffs/day.    Follow up : 4-6 weeks, sooner if needed.  It was a pleasure meeting you in clinic today! Thank you for allowing me to participate in your care.

## 2023-05-19 NOTE — Progress Notes (Signed)
 FOLLOW UP Date of Service/Encounter:  05/19/23  Subjective:  Annette  Wade (DOB: November 01, 1950) is a 73 y.o. female who [facility] in re-evaluation of the following: acute visit for cough History obtained from: chart review and patient.  For Review, LV was on 04/29/23  with Dr. Luke seen for routine follow-up. For asthma, allergic rhinitis and GERD.  See below for summary of history and diagnostics.   Therapeutic plans/changes recommended: blood work for environmental allergies ordered. Complained of coughing and drianage, normal spiro. Recent Augmentin  give at [facility] not effective. ----------------------------------------------------- Pertinent History/Diagnostics:  Mild intermittent asthma without complication No Symbicort  of Singulair  in 2024.  Recently asymptomatic. Current meds: singulair  restarted 04/29/23. Allergic Rhinitis:  Hoarseness, cough, and rhinorrhea ongoing for a month. Failed Augmentin  for presumed sinusitis. Zyrtec ineffective. 2018 skin testing was positive to mold.  -current meds: singulair , azelastine  nasal spray PRN Eustachian tube dysfunction, bilateral Referral to ENT placed at visit 04/29/23 Other: multiple myeloma s/p BMT, with bone mets.  Reflux on omeprazole . 1-2 times daily.  --------------------------------------------------- Today presents for follow-up. Discussed the use of AI scribe software for clinical note transcription with the patient, who gave verbal consent to proceed.  History of Present Illness   The patient, with a history of asthma and reflux, presents with a persistent cough of approximately three to four weeks duration. The cough has been worsening and is particularly bothersome at night. Despite the use of albuterol  inhaler, there has been no relief. The patient denies experiencing shortness of breath or wheezing.  The cough is sometimes dry, sometimes productive of white, creamy sputum. It is not  worse at any particular time of day, but it does wake the patient at night. The patient reports some chest tightness and pain with coughing.  The patient is currently on omeprazole  for reflux, which she takes on an empty stomach, and will take a second dose if needed at night. She reports good control of reflux symptoms. However, she has noticed significant drainage down her throat.  In addition to the cough, the patient reports pain and pressure above the eyes and below, which has been present since her last doctor's visit. She was previously treated with a seven-day course of Augmentin  for a suspected sinus infection, but this did not alleviate symptoms.  The patient has been on chemotherapy and reports some side effects including hot flashes and reduced appetite. She also reports feeling tired, which she attributes to the medication. She denies any recent fevers or night sweats.      Chart Review: Heme/Onc visit 05/10/23: Multiple myeloma - Clinically stable. S/p stem cell [facility] at [facility] in March 2022. Patient has been following up regularly with them.SABRASABRASABRABone mets - on Zometa. Last received Zometa on 03/17/2023. Receiving Zometa today, 05/10/2023. We are checking calcium before giving Zometa.   All medications reviewed by clinical staff and updated in chart. No new pertinent medical or surgical history except as noted in HPI.  ROS: All others negative except as noted per HPI.   Objective:  BP 116/70   Pulse 78   Temp 98.2 F (36.8 C) (Temporal)   Resp 16   Ht 5' 4 (1.626 m)   Wt 161 lb (73 kg)   SpO2 96%   BMI 27.64 kg/m  Body mass index is 27.64 kg/m. Physical Exam: General Appearance:  Alert, cooperative, no distress, appears stated age  Head:  Normocephalic, without obvious abnormality, atraumatic  Eyes:  Conjunctiva clear,  EOM's intact  Ears EACs normal bilaterally and normal TMs bilaterally  Nose: Nares normal, normal mucosa, no visible anterior polyps,  and septum midline  Throat: Lips, tongue normal; teeth and gums normal, normal posterior oropharynx  Neck: Supple, symmetrical  Lungs:   clear to auscultation bilaterally, Respirations unlabored, intermittent dry coughing  Heart:  regular rate and rhythm and no murmur, Appears well perfused  Extremities: No edema  Skin: Skin color, texture, turgor normal and no rashes or lesions on visualized portions of skin  Neurologic: No gross deficits   Labs:  Lab Orders  No laboratory test(s) ordered today    Spirometry:  Tracings reviewed. Her effort: Good reproducible efforts.  Coughing throughout exam. FVC: 2.25L FEV1: 1.90L, 91% predicted FEV1/FVC ratio: 0.84 Interpretation: Spirometry consistent with normal pattern.  Please see scanned spirometry results for details.  Assessment/Plan   Assessment and Plan    Chronic Cough Persistent cough for 3-4 weeks, worsening at night, unresponsive to albuterol  or Singulair . No associated wheezing or shortness of breath. Previous PFTs were normal. Hx of multiple myeloma s/p BMT with bone mets. -Order chest X-ray STAT given history of bone mets. -Breathing test today looks good, not responding to albuterol  but history of asthma -Notify oncologist about persistent cough.  Possible Sinusitis Complaints of pain and pressure above and below eyes, postnasal drainage, and previous treatment with Augmentin  for 7 days without improvement. -Start Bactrim  for 14 days. -If no improvement, consider CT scan of sinuses.  Gastroesophageal Reflux Disease (GERD) Currently controlled on Omeprazole , taken before meals, with additional dose as needed at night. -Continue current regimen.   Asthma History of asthma, currently on Singulair . No current wheezing reported. -Breathing test today looks good, not responding to albuterol  but history of asthma - continue singulair  10 mg daily for asthma/allergies. - in place of albuterol , use Symbicort  80 mcg 2-4 puffs  every 4 hours as needed and can use during asthma flares/respiratory illness: At onset of respiratory illness/asthma flare: Inhale 2 puffs twice daily with spacer for 2 weeks or until symptoms resolve. Maximum 12 puffs/day.    Follow up : 4-6 weeks, sooner if needed.  It was a pleasure meeting you in clinic today! Thank you for allowing me to participate in your care.  Rocky Endow, MD [facility]    Other: none  Rocky Endow, MD  [facility] 

## 2023-05-21 ENCOUNTER — Ambulatory Visit (HOSPITAL_BASED_OUTPATIENT_CLINIC_OR_DEPARTMENT_OTHER): Payer: Medicare HMO | Attending: Internal Medicine

## 2023-05-23 ENCOUNTER — Encounter: Payer: Self-pay | Admitting: Internal Medicine

## 2023-05-23 MED ORDER — AZITHROMYCIN 250 MG PO TABS: ORAL_TABLET | ORAL | 0 refills | Status: DC

## 2023-05-23 NOTE — Progress Notes (Signed)
 Patient called stating cough has worsened despite taking prednisone  and Bactrim  as prescribed on Wednesday for presumed sinusitis. She has already failed Augmentin  for same cough/congestion.  She does not feel benefit from albuterol  or her Symbicort , but has tried using it.  Her CXR from Wednesday was read as normal.  Her spirometry in clinic looked normal without signs of obstruction.  Do not suspect asthma exacerbation.  She has had same cough for several weeks with head congestion.  I did recommend she notify her oncologist and she did on Friday but has not heard back.  I am sending in azithromycin  to treat potential atypical pneumonia, but discussed that if no improvement would recommend ED visit due to her complicated medical history and at that point would have failed 3 outpatient antibiotics.   Patient voiced understanding. Our office will call her tomorrow to see how she is doing.

## 2023-05-24 ENCOUNTER — Telehealth: Payer: Self-pay

## 2023-05-24 NOTE — Telephone Encounter (Signed)
 I will call her later today to check-in.  thanks

## 2023-05-24 NOTE — Telephone Encounter (Signed)
 Pt called in and said she is feeling maybe a tiny bit better but she is not feeling the greatest. She did call oncology waiting for a call back. She is resting and drinking fluids

## 2023-05-25 ENCOUNTER — Other Ambulatory Visit: Payer: Self-pay

## 2023-05-25 DIAGNOSIS — J3089 Other allergic rhinitis: Secondary | ICD-10-CM

## 2023-05-26 ENCOUNTER — Encounter (INDEPENDENT_AMBULATORY_CARE_PROVIDER_SITE_OTHER): Payer: Self-pay | Admitting: Otolaryngology

## 2023-05-27 ENCOUNTER — Telehealth (HOSPITAL_BASED_OUTPATIENT_CLINIC_OR_DEPARTMENT_OTHER): Payer: Self-pay

## 2023-05-27 ENCOUNTER — Other Ambulatory Visit: Payer: Self-pay

## 2023-05-27 NOTE — Telephone Encounter (Signed)
I didn't finish last note. Pt calling bc she needs to know where her ct maxillofacial facial will be and the time. I did tell her she will be getting her ct scan sinuses at [facility] and I was only able to leave a message for the [facility] to call patient with time of scan and Hilda Lias in our practice will call [facility] to get time for the patient as well. Patient was kind of in a panic bc she didn't write it down when they called her for her appointment. Pt. Aware.

## 2023-05-27 NOTE — Telephone Encounter (Signed)
Patient calling needing to know when and where her ct

## 2023-05-28 NOTE — Telephone Encounter (Signed)
Marie Engineer, maintenance (IT) and spoke to [facility]- pt no showed for scan on Jan 10th and she will call pt and reschedule today.

## 2023-05-31 ENCOUNTER — Telehealth: Payer: Self-pay

## 2023-05-31 NOTE — Telephone Encounter (Signed)
Received call from [facility] - PA needs to be done on CT Maxillofacial w/o contrast.  Forwarding message to [facility].

## 2023-06-01 NOTE — Telephone Encounter (Signed)
Spoke with 5 different people to get a precert going for pt case # [id number] waiting on fax response once reviewed

## 2023-06-02 NOTE — Telephone Encounter (Signed)
[facility] wanted office notes and drug list s had Annette Wade fax all these info to [facility]

## 2023-06-03 ENCOUNTER — Ambulatory Visit (HOSPITAL_BASED_OUTPATIENT_CLINIC_OR_DEPARTMENT_OTHER): Payer: Medicare HMO

## 2023-06-04 NOTE — Telephone Encounter (Signed)
-  Peer to peer was completed and approved [phone]

## 2023-06-04 NOTE — Telephone Encounter (Signed)
Peer to peer review set up for today with Dr Maurine Minister and Dr Barnabas Lister from insurance, at 1pm today 06/04/2023

## 2023-06-04 NOTE — Telephone Encounter (Signed)
Approved Jan 21-July 23rd 2025

## 2023-06-09 ENCOUNTER — Ambulatory Visit (HOSPITAL_BASED_OUTPATIENT_CLINIC_OR_DEPARTMENT_OTHER)
Admission: RE | Admit: 2023-06-09 | Discharge: 2023-06-09 | Disposition: A | Discharge status: Home / Self Care | Payer: Medicare HMO | Source: Ambulatory Visit | Attending: Internal Medicine | Admitting: Internal Medicine

## 2023-06-09 DIAGNOSIS — J3089 Other allergic rhinitis: Secondary | ICD-10-CM | POA: Insufficient documentation

## 2023-06-23 ENCOUNTER — Ambulatory Visit (INDEPENDENT_AMBULATORY_CARE_PROVIDER_SITE_OTHER): Payer: Medicare HMO | Admitting: Internal Medicine

## 2023-06-23 ENCOUNTER — Other Ambulatory Visit: Payer: Self-pay

## 2023-06-23 ENCOUNTER — Encounter: Payer: Self-pay | Admitting: Internal Medicine

## 2023-06-23 VITALS — BP 132/78 | HR 97 | Temp 98.0°F | Resp 20

## 2023-06-23 DIAGNOSIS — J3089 Other allergic rhinitis: Secondary | ICD-10-CM | POA: Diagnosis not present

## 2023-06-23 DIAGNOSIS — C9000 Multiple myeloma not having achieved remission: Secondary | ICD-10-CM | POA: Insufficient documentation

## 2023-06-23 DIAGNOSIS — K219 Gastro-esophageal reflux disease without esophagitis: Secondary | ICD-10-CM | POA: Diagnosis not present

## 2023-06-23 DIAGNOSIS — J320 Chronic maxillary sinusitis: Secondary | ICD-10-CM | POA: Diagnosis not present

## 2023-06-23 DIAGNOSIS — J4520 Mild intermittent asthma, uncomplicated: Secondary | ICD-10-CM | POA: Diagnosis not present

## 2023-06-23 MED ORDER — AZELASTINE HCL 0.1 % NA SOLN
1.0000 | Freq: Two times a day (BID) | NASAL | 3 refills | Status: AC | PRN
Start: 2023-06-23 — End: ?

## 2023-06-23 MED ORDER — MONTELUKAST SODIUM 10 MG PO TABS: 10.0000 mg | ORAL_TABLET | Freq: Every day | ORAL | 3 refills | Status: DC

## 2023-06-23 NOTE — Progress Notes (Signed)
FYI-Reviewed with patient in clinic today.

## 2023-06-23 NOTE — Patient Instructions (Addendum)
Chronic Cough Persistent cough for 3-4 weeks, worsening at night, unresponsive to albuterol or Singulair. No associated wheezing or shortness of breath. Previous PFTs were normal. Hx of multiple myeloma s/p BMT with bone mets. -CXR 05/2023: read as left base atelectasis, otherwise normal -Breathing test today looked good at visit 05/19/23. Interim Hx: improvement following course of bactrim x 14 days. Suspected related to drainage.   Chronic sinusitis Complaints of pain and pressure above and below eyes, postnasal drainage, and previous treatment with Augmentin for 7 days without improvement. Hx of prior sinus surgery. -Interim hx; treated with Bactrim for 14 days.and prednisone burst. Significant improvement in symptoms, but still having some drainage and cough.  -CT sinus scan 06/09/2023: shows signs of infection in left maxillary sinus and signs of previous sinus surgery. -will refer to ENT. -start sinus rinses with Loralyn Freshwater use distilled water. Aim for 1-2 times per day.  Gastroesophageal Reflux Disease (GERD) Currently controlled on Omeprazole, taken before meals, with additional dose as needed at night. -Continue current regimen.   Asthma History of asthma, currently on Singulair. No current wheezing reported. -Breathing test 05/19/23 looked good, not responding to albuterol but history of asthma Interim Hx: coughing improved, but occasional wheezing episodes. Relieved with symbicort - Start Symbicort 80 mcg 2 puffs every morning.  - continue singulair 10 mg daily for asthma/allergies. - in place of albuterol, use Symbicort 80 mcg 2-4 puffs every 4 hours as needed and can use during asthma flares/respiratory illness: At onset of respiratory illness/asthma flare: Inhale 2 puffs twice daily with spacer for 2 weeks or until symptoms resolve. Maximum 12 puffs/day.    Follow up : 12 weeks, sooner if needed.  It was a pleasure seeing you again in clinic today! Thank you for allowing  me to participate in your care.

## 2023-06-23 NOTE — Progress Notes (Signed)
FOLLOW UP Date of Service/Encounter:  06/23/23  Subjective:  Annette Wade (DOB: 1950-06-24) is a 73 y.o. female who [facility]23 in re-evaluation of the following: Intermittent asthma, allergic rhinitis, eustachian tube dysfunction and reflux here for follow-up of recent chronic cough and sinusitis History obtained from: chart review and patient.  For Review, LV was on 05/19/23  with Dr.Tamesha Ellerbrock seen for acute visit for chronic cough and concerns for sinusitis. . See below for summary of history and diagnostics.   Therapeutic plans/changes recommended: High-dose Bactrim twice daily x 14 days.  We also switched her from albuterol rescue to Symbicort Smart therapy. ----------------------------------------------------- Pertinent History/Diagnostics:  Mild intermittent asthma without complication No Symbicort of Singulair in 2024.  Recently asymptomatic. Current meds: singulair restarted 04/29/23. CXR 05/19/23: read as left base atelactasis otherwise normal. Allergic Rhinitis:  Hoarseness, cough, and rhinorrhea ongoing for a month. Failed Augmentin for presumed sinusitis. Zyrtec ineffective. 2018 skin testing was positive to mold.  She has had previous sinus surgery with Dr. Oretha Milch -current meds: singulair, azelastine nasal spray PRN CT scan 06/09/23: Postsurgical changes from prior ethmoidectomies and bilateral maxillary antrostomies. 2. Mucosal thickening and fluid level within the left maxillary sinus. 3. Trace mucosal thickening of the right sphenoid sinus. Eustachian tube dysfunction, bilateral Referral to ENT placed at visit 04/29/23 Other: multiple myeloma s/p BMT, with bone mets.  Reflux on omeprazole. 1-2 times daily.  --------------------------------------------------- Today presents for follow-up. Discussed the use of AI scribe software for clinical note transcription with the patient, who gave verbal consent to  proceed.  History of Present Illness   Annette Annette "Ginger" is a 73 year old female who presents with a persistent cough and sinus drainage.  She has a persistent dry, heavy cough that has improved since her last visit but still occurs occasionally. The cough is sometimes triggered by sinus drainage. No facial pressure is noted, but there is ongoing drainage, which she believes contributes to her cough.  A CT scan of the sinuses revealed fluid in the left maxillary sinus and evidence of prior sinus surgery. She recalls having sinus surgery performed by Dr. Oretha Milch husband in [place], but she no longer follows up with an ENT as they have both moved.  She has been using a Symbicort inhaler, which she finds helpful, particularly when experiencing wheezing. She uses it as needed, approximately once a week. She experienced wheezing about a week ago, which was relieved by using the Symbicort inhaler.  She mentions a history of insurance issues regarding the approval of a CT scan, which was eventually resolved, allowing her to undergo the scan.       All medications reviewed by clinical staff and updated in chart. No new pertinent medical or surgical history except as noted in HPI.  ROS: All others negative except as noted per HPI.   Objective:  BP 132/78   Pulse 97   Temp 98 F (36.7 C) (Temporal)   Resp 20   SpO2 98%  There is no height or weight on file to calculate BMI. Physical Exam: General Appearance:  Alert, cooperative, no distress, appears stated age  Head:  Normocephalic, without obvious abnormality, atraumatic  Eyes:  Conjunctiva clear, EOM's intact  Ears EACs normal bilaterally and normal TMs bilaterally  Nose: Nares normal, hypertrophic turbinates, normal mucosa, and no visible anterior polyps  Throat: Lips, tongue normal; teeth and gums normal, normal posterior oropharynx and + cobblestoning  Neck: Supple, symmetrical  Lungs:  clear to auscultation bilaterally,  Respirations unlabored, no coughing  Heart:  regular rate and rhythm and no murmur, Appears well perfused  Extremities: No edema  Skin: Skin color, texture, turgor normal and no rashes or lesions on visualized portions of skin  Neurologic: No gross deficits   Labs:  Lab Orders  No laboratory test(s) ordered today   Assessment/Plan   Chronic Cough-improved, not at goal Persistent cough for 3-4 weeks, worsening at night, unresponsive to albuterol or Singulair. No associated wheezing or shortness of breath. Previous PFTs were normal. Hx of multiple myeloma s/p BMT with bone mets. -CXR 05/2023: read as left base atelectasis, otherwise normal -Breathing test today looked good at visit 05/19/23. Interim Hx: improvement following course of bactrim x 14 days. Suspected related to drainage.   Chronic sinusitis-improved, not at goal Complaints of pain and pressure above and below eyes, postnasal drainage, and previous treatment with Augmentin for 7 days without improvement. Hx of prior sinus surgery. -Interim hx; treated with Bactrim for 14 days.and prednisone burst. Significant improvement in symptoms, but still having some drainage and cough.  -CT sinus scan 06/09/2023: shows signs of infection in left maxillary sinus and signs of previous sinus surgery. -will refer to ENT to establish care, have access to biopsies if needed for future therapy -start sinus rinses with Loralyn Freshwater use distilled water. Aim for 1-2 times per day.  Gastroesophageal Reflux Disease (GERD)-at goal Currently controlled on Omeprazole, taken before meals, with additional dose as needed at night. -Continue current regimen.   Asthma-at goal History of asthma, currently on Singulair. No current wheezing reported. -Breathing test 05/19/23 looked good, not responding to albuterol but history of asthma Interim Hx: coughing improved, but occasional wheezing episodes. Relieved with symbicort - Start Symbicort 80 mcg 2 puffs  every morning.  - continue singulair 10 mg daily for asthma/allergies. - in place of albuterol, use Symbicort 80 mcg 2-4 puffs every 4 hours as needed and can use during asthma flares/respiratory illness: At onset of respiratory illness/asthma flare: Inhale 2 puffs twice daily with spacer for 2 weeks or until symptoms resolve. Maximum 12 puffs/day.    Follow up : 12 weeks, sooner if needed.  It was a pleasure seeing you again in clinic today! Thank you for allowing me to participate in your care.  Other: none  Tonny Bollman, MD  [facility]

## 2023-06-29 ENCOUNTER — Ambulatory Visit: Payer: Medicare HMO | Admitting: Allergy

## 2023-07-09 NOTE — Progress Notes (Signed)
 Hey they have contacted the patient twice via phone and sent letters, no response. I sent her another mychart message.   Thanks

## 2023-09-15 ENCOUNTER — Encounter: Payer: Self-pay | Admitting: Internal Medicine

## 2023-09-15 ENCOUNTER — Ambulatory Visit: Payer: Medicare HMO | Admitting: Internal Medicine

## 2023-09-15 VITALS — BP 122/74 | HR 76 | Temp 97.5°F | Resp 16

## 2023-09-15 DIAGNOSIS — K219 Gastro-esophageal reflux disease without esophagitis: Secondary | ICD-10-CM | POA: Diagnosis not present

## 2023-09-15 DIAGNOSIS — R42 Dizziness and giddiness: Secondary | ICD-10-CM | POA: Insufficient documentation

## 2023-09-15 DIAGNOSIS — J4520 Mild intermittent asthma, uncomplicated: Secondary | ICD-10-CM

## 2023-09-15 DIAGNOSIS — J3089 Other allergic rhinitis: Secondary | ICD-10-CM | POA: Diagnosis not present

## 2023-09-15 NOTE — Patient Instructions (Addendum)
 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  05/19/23: Reported a cough with drainage for multiple months. Failed  round of Augmentin . Complicated medical history due to MM with mets.  Prescribed bactrim  x 14 days 05/19/23-symptoms eventually resolved.  2018 allergy testing positive to molds. 2024 serum environmental allergens ordered but not obtained. -CT sinus scan 06/09/2023: shows signs of infection in left maxillary sinus and signs of previous sinus surgery. Improved after bactrim  x 14 days. Interim Hx: Doing well, cough resolved. Now reporting vertigo improved on meclizine. No new chemotherapy medications. -send to ENT-will send new referral for ENT in [place] (Dr. Hildy Lowers) for vertigo -if congestion/drainage recurs: start sinus rinses with Bettyjane Brunet use distilled water. Aim for 1-2 times per day. -Continue Flonase  1 spray in each nostril daily as needed and azelastine  nasal spray 1-2 spray in each nostril 1-2 times daily as needed  Gastroesophageal Reflux Disease (GERD) Currently controlled on Omeprazole , taken before meals, with additional dose as needed at night. -Continue current regimen.   Asthma History of asthma, currently on Singulair . No current wheezing reported. -Breathing test 05/19/23 looked good, not responding to albuterol  but history of asthma Interim Hx: doing well without daily meds. Singulair  suspected to cause her to feel very shaky after taking -if you have a respiratory illness/asthma flare: Symbicort  80 mcg -Inhale 2 puffs twice daily with spacer for 1-2 weeks or until symptoms resolve. - discontinue singulair -due to side effectiveness (shakiness) - in place of albuterol , use Symbicort  80 mcg 2-4 puffs every 4 hours as needed and can use during asthma flares/respiratory illness: At onset of respiratory illness/asthma flare: Inhale 2 puffs twice daily with spacer for 2 weeks or until symptoms resolve. Maximum 12 puffs/day.    Follow up : 6 months,  sooner if needed.  It was a pleasure seeing you again in clinic today! Thank you for allowing me to participate in your care.

## 2023-09-15 NOTE — Progress Notes (Signed)
 FOLLOW UP Date of Service/Encounter:  09/15/23  Subjective:  Annette Wade (DOB: 1950-06-14) is a 73 y.o. female who [facility] in re-evaluation of the following: Intermittent asthma, allergic rhinitis, eustachian tube dysfunction and reflux  History obtained from: chart review and patient.  For Review, LV was on 06/23/23  with Dr.Tywana Robotham seen for routine follow-up. See below for summary of history and diagnostics.   Therapeutic plans/changes recommended: We had given her a course of Bactrim  for 14 days for suspected sinus infection and that had improved her cough which was not responding to albuterol  or Symbicort .  We did refer to ENT for her to establish care and have access to biopsies in the future if needed due to her complicated medical history. ----------------------------------------------------- Pertinent History/Diagnostics:  Mild intermittent asthma without complication No Symbicort  or Singulair  in 2024.  Recently asymptomatic. Current meds: singulair  restarted 04/29/23. CXR 05/19/23: read as left base atelactasis otherwise normal. Allergic Rhinitis:  Hoarseness, cough, and rhinorrhea ongoing for a month. Failed Augmentin  for presumed sinusitis. Zyrtec ineffective. 2018 skin testing was positive to mold.  She has had previous sinus surgery with Dr. Gershon Kroner years ago. -current meds: singulair , azelastine  nasal spray PRN CT scan 06/09/23: Postsurgical changes from prior ethmoidectomies and bilateral maxillary antrostomies. 2. Mucosal thickening and fluid level within the left maxillary sinus. 3. Trace mucosal thickening of the right sphenoid sinus. -chronic sinusitis treated with Bactrim  x 14 days (2025) after failed trial of Augmentin  Eustachian tube dysfunction, bilateral Referral to ENT placed at visit 04/29/23 Other: multiple myeloma s/p BMT, with bone mets.  Reflux on omeprazole . 1-2 times daily.   --------------------------------------------------- Today presents for follow-up. Discussed the use of AI scribe software for clinical note transcription with the patient, who gave verbal consent to proceed.  History of Present Illness   Annette Wade "Annette Wade" is a 73 year old female who presents with concerns about recent episodes of vertigo.  She experienced significant vertigo last week, describing it as 'really bad'. Meclizine and bed rest seemed to help alleviate the symptoms. She is unsure if the vertigo is a side effect of her current medications, as she has not experienced it before with her current regimen but she has not discussed with oncologist. No need for refills of meclizine for vertigo. She has not yet established with ENT.   Her coughing has improved significantly, and she has not needed to use her rescue inhaler frequently, only using it three or four times since previous visit. She is not currently using Symbicort  regularly but has it available for respiratory illnesses or asthma flares. She continues to take Singulair  but has experienced shakiness, which she suspects might be related to the medication.  She is undergoing cancer treatment, with infusions every other month and taking 21 pills monthly. She visits the cancer center once a month for labs and consultations. She reports feeling okay after the infusions, although she finds the pill regimen challenging.  Her drainage has improved, although she notes that smelling someone mowing grass can exacerbate her symptoms. She continues to take omeprazole  once or sometimes twice a day. Her cough suspected related to sinusitis is now absent.      All medications reviewed by clinical staff and updated in chart. No new pertinent medical or surgical history except as noted in HPI.  ROS: All others negative except as noted per HPI.   Objective:  BP 122/74   Pulse 76   Temp (!) 97.5 F (36.4  C) (Temporal)   Resp 16   SpO2  96%  There is no height or weight on file to calculate BMI. Physical Exam: General Appearance:  Alert, cooperative, no distress, appears stated age  Head:  Normocephalic, without obvious abnormality, atraumatic  Eyes:  Conjunctiva clear, EOM's intact  Ears EACs normal bilaterally and normal TMs bilaterally  Nose: Nares normal, hypertrophic turbinates, normal mucosa, and no visible anterior polyps  Throat: Lips, tongue normal; teeth and gums normal, normal posterior oropharynx  Neck: Supple, symmetrical  Lungs:   clear to auscultation bilaterally, Respirations unlabored, no coughing  Heart:  regular rate and rhythm and no murmur, Appears well perfused  Extremities: No edema  Skin: Skin color, texture, turgor normal and no rashes or lesions on visualized portions of skin  Neurologic: Generalized tremor at rest noted, head bobbing   Labs:  Lab Orders  No laboratory test(s) ordered today   Assessment/Plan   Recurrent sinusitis and vertigo, allergic rhinitis to molds. Stable with new onset vertigo. 05/19/23: Reported a cough with drainage for multiple months. Failed  round of Augmentin . Complicated medical history due to MM with mets.  Prescribed bactrim  x 14 days 05/19/23-symptoms eventually resolved.  2018 allergy testing positive to molds. 2024 serum environmental allergens ordered but not obtained. -CT sinus scan 06/09/2023: shows signs of infection in left maxillary sinus and signs of previous sinus surgery. Improved after bactrim  x 14 days. Interim Hx: Doing well, cough resolved. Now reporting vertigo improved on meclizine. No new chemotherapy medications. -send to ENT-will send new referral for ENT in [place] (Dr. Hildy Lowers) for vertigo -if congestion/drainage recurs: start sinus rinses with Bettyjane Brunet use distilled water. Aim for 1-2 times per day. -Continue Flonase  1 spray in each nostril daily as needed and azelastine  nasal spray 1-2 spray in each nostril 1-2 times daily as  needed  Gastroesophageal Reflux Disease (GERD)-stable Currently controlled on Omeprazole , taken before meals, with additional dose as needed at night. -Continue current regimen.   Asthma-at goal History of asthma, currently on Singulair . No current wheezing reported. -Breathing test 05/19/23 looked good, not responding to albuterol  but history of asthma Interim Hx: doing well without daily meds. Singulair  suspected to cause her to feel very shaky after taking -if you have a respiratory illness/asthma flare: Symbicort  80 mcg -Inhale 2 puffs twice daily with spacer for 1-2 weeks or until symptoms resolve. - discontinue singulair -due to side effectiveness (shakiness) - in place of albuterol , use Symbicort  80 mcg 2-4 puffs every 4 hours as needed and can use during asthma flares/respiratory illness: At onset of respiratory illness/asthma flare: Inhale 2 puffs twice daily with spacer for 2 weeks or until symptoms resolve. Maximum 12 puffs/day.    Follow up : 6 months, sooner if needed.  It was a pleasure seeing you again in clinic today! Thank you for allowing me to participate in your care.  Other: none  Jonathon Neighbors, MD  [facility] 

## 2023-10-05 ENCOUNTER — Telehealth: Payer: Self-pay | Admitting: Internal Medicine

## 2023-10-05 NOTE — Telephone Encounter (Signed)
 Gabrielly  has been referred to: [facility] [address] [place], [place] [address] [phone]  **Amarya  is scheduled for 10/12/23 at 10:00 am with [facility], [facility].  Darina  has been notified of this appointment.  FYI:  Dr. Hildy Lowers is no longer with [facility] as of today

## 2023-12-14 NOTE — Progress Notes (Signed)
 Hematology/Oncology Follow-up Visit  Patient Name:  Annette  Brittle Wade Date of Birth:  04/06/1951 Date of Encounter:  12/16/2023   Referring Provider:  Darilyn Rosalva Palma*, [phone]  PCP:  Cole Christopher Podraza, PA-C  Diagnoses:  Multiple myeloma     Hematologic/Oncologic History: Annette  Brittle Wade is a 73 y.o. with multiple myeloma.  She was diagnosed with MGUS in 2019 for slightly worsening Crt and globulin gap, SPEP with M protein 1.46, IgG lambda by IFE; no bone marrow biopsy was performed at that time.   Around 04/2019 she slipped and almost fell, injuring her left leg, and eventually, CT A/P and bone scan were obtained which showed malignant appearing left iliac lytic bone lesion, which was biopsied 10/2019 and found to be plasma cell neoplasm. Posterior iliac crest bone marrow biopsy was performed 10/30/2019 and showed 80% plasma cells, normal cytogenetics, FISH with +4p,+11,+14,+17p (standard risk). SPEP with M protein 4.5, IgG lambda by IFE, with K/L ratio 0.25, UPEP negative. Crt was 1.15 (from 0.8  a few years prior), and Hg 11.3 (normal a few years prior). Metastatic skeletal survey was notable for diffuse lytic lesions including in bilateral femurs; she underwent radiation to both femurs, with improvement in her pain. She started RVD-based regimen  on 11/29/2020 with me at [facility] (bortezomib 1.3 mg/m2 with dex 20mg  on D1,8,15 of 28 days plus revlimid 10mg  for 21 of 28 days). At some point, her bortezomib dose was reduced by 25%.  She had also been getting Zometa monthly. Myeloma labs on 04/18/2020 showed complete remission with SPEP without M protein detected, IFE polyclonal, and K/L ratio 3.15.   She was seen in the AB[facility] clinic on 06/19/20 for evaluation for high dose therapy and was felt to be an  appropriate candidate. Labs revealed no M-spike on SPEP with a possible faint IgG kappa on IFE, IgG 830 mg/dL and serum free kappa light  chain level 2.17 mg/dL, lambda 8.97 mg/dL and ratio 7.86. She completed the lab portion of her pretransplant workup  and had no contraindications for high dose therapy. She was to complete her current cycle of Revlimid and then return to complete her workup and restaging.  She returned on 07/08/20. Echocardiogram revealed LVEF >55% with normal wall motion and  GLS -15.7%. ECG revealed NSR with QTc 442 msec. Chest x-ray revealed minimal lingular and right basilar atelectasis. PFTs revealed FVC 98%, FEV1 106% and DLCO 68%. A bone marrow was 10-15% cellular with no plasma cells. An aspirate could not be obtained  for cytogenetics and FISH.  She started G-CSF on 07/20/20 and had a Hickman catheter placed on 07/23/20 followed by [facility]. She underwent apheresis on 07/24/20 with collection of 9.81 x 10e6 CD34 cells/kg.  She was seen on 07/29/20 and  was overall doing well. She had some fatigue, but her activity level was near normal. She had a history of tremors and used a cane for balance. She had no peripheral neuropathy or focal weakness. She had no bone pain. She had a history of asthma, is on  Symbicort , but rarely used her rescue inhaler. She had a history of depression/anxiety and her mood was stable on her current meds. She had obtained at least a VGPR with RVd therapy and completed stem cell collection. Her age placed her in a borderline  category for melphalan 140 vs 200 mg/m2, but given her comorbidities, she was given 140 mg/m2 dose.   She received Melphalan 140mg /m2 on 08/01/20 in the  outpatient day hospital. She received her autologous stem cell infusion on 08/02/20 (4.86 x  10^6 CD34+ cell/kg). By 08/05/20 she had persistent nausea and diarrhea such that she was unable to take pills. She was admitted to [facility] unit 10A.   Urine and blood cultures were obtained and were negative. Her medications were changed to IV on admission. C.diff was negative and diarrhea improved with Immodium. She was placed on  scheduled Zofran, scopolamine patch, Emend given on 08/08/20 and  08/11/20, as well as Dexamethasone 4 mg IV on 08/09/20. She had modest improvement in nausea each day. She continued with persistent diarrhea which improved after an increase in Imodium dosing to 4 mg on 08/14/20. Her prophylactic fungal and bacterial coverage  were discontinued on 08/14/20 due to engraftment. Her Granix was also stopped at that time. Her medications were switched back to PO on 08/15/20. She continued with improved oral intake. Her CVC was removed on 08/16/20. Patient and daughter were agreeable to  discharge back to local housing on 08/16/20 and she was discharged from the day hospital to home on 08/17/20.  She was seen at her local oncologist's office on 09/04/20 reporting nausea, fatigue and lightheaded with standing and received a liter of saline.  She was seen in our clinic on 09/09/20 with continued nausea, anorexia and fatigue. Labs revealed no M-spike on SPEP with a possible faint IgG kappa and lambda constriction on IFE, IgG 884 mg/dL and serum free kappa light chain level  1.96 mg/dL, lambda 9.22 mg/dL and ratio 7.44. A random cortisol level was normal. She received intravenous fluids and dexamethasone and her blood pressure medications were adjusted.  She was seen on 11/04/20, and was feeling better. Labs revealed no evidence of progressive disease. We recommended maintenance Revlimid 10 mg daily for 21/28 days.  She is now on Revlimid, along with aspirin.  She developed some pain and mild swelling in her right lower leg and ankle, and had an ultrasound on 01/30/2021 that revealed superficial thrombus in the distal greater saphenous vein, extending from the ankle to the knee, but no DVT.  On 02/03/2021, she reported her activity level is near normal. She has some back pain and some numbness and tingling in her feet. She still has nausea off and on, but not every day, and no emesis. She also has some loose stools. The symptoms are  a little better on her off week of Revlimid.  She still has some pain and mild swelling in her right foot.  She is on Zometa, every 8 weeks. Held on 06/07/2023 due to elevated creatinine levels. Patient last received Zometa on 10/19/2023. She is due Zometa on 12/16/2023.    Interim Note:  Annette  Brittle Wade returns today for follow up of multiple myeloma. She is doing okay today, aside form pain. Two weeks ago, she started experiencing pain in her lower back. The pain resolves if she lays and rests for a while. Patient denies any symptoms such as fever, chills, night sweats, chest pain, shortness of breath, constipation. ECOG performance status is 2   Review of Systems: All other systems are negative.  Current Outpatient Medications:  .  albuterol  HFA (Ventolin  HFA) 90 mcg/actuation inhaler, Inhale 2 puffs every 6 (six) hours as needed (wheezing)., Disp: , Rfl:  .  aspirin 81 mg EC tablet, Take 81 mg by mouth Once Daily., Disp: 30 tablet, Rfl: 6 .  atorvastatin (LIPITOR) 20 mg tablet, Take 1 tablet (20 mg total) by mouth at bedtime., Disp:  90 tablet, Rfl: 3 .  azelastine  (ASTELIN ) 137 mcg (0.1 %) nasal spray, Administer 1-2 sprays into affected nostril(s)., Disp: , Rfl:  .  budesonide -formoteroL  (Symbicort ) 160-4.5 mcg/actuation inhaler, Inhale 2 puffs 2 (two) times a day as needed., Disp: , Rfl:  .  cholestyramine 4 g packet, Take 1 packet (4 g total) by mouth 3 (three) times a day as needed (diarrhea)., Disp: 30 packet, Rfl: 3 .  clonazePAM (KlonoPIN) 1 mg tablet, Take 1 mg by mouth 3 (three) times a day as needed., Disp: , Rfl:  .  diphenoxylate-atropine (LOMOTIL) 2.5-0.025 mg per tablet, TAKE 1 TABLET BY MOUTH 4 TIMES DAILY AS NEEDED FOR DIARRHEA, Disp: 30 tablet, Rfl: 0 .  DULoxetine (CYMBALTA) 60 mg capsule, Take 60 mg by mouth Once Daily., Disp: , Rfl:  .  ergocalciferol (VITAMIN D2) 1,250 mcg (50,000 unit) capsule, Take 1 capsule (50,000 Units total) by mouth once a week., Disp: 12  capsule, Rfl: 1 .  fluticasone  propionate (FLONASE ) 50 mcg/spray nasal spray, Administer 1 spray into each nostril daily as needed., Disp: , Rfl:  .  gabapentin (NEURONTIN) 100 mg capsule, Take 1 capsule (100 mg total) by mouth 2 (two) times a day., Disp: 180 capsule, Rfl: 3 .  lenalidomide (Revlimid) 10 mg cap, Take 1 capsule (10 mg total) by mouth daily. . For 21 days and then 7 days off, Disp: 21 capsule, Rfl: 0 .  levothyroxine (SYNTHROID) 88 mcg tablet, Take 1 tablet (88 mcg total) by mouth daily., Disp: 90 tablet, Rfl: 3 .  loperamide (IMODIUM) 2 mg capsule, Take 4 mg by mouth 4 (four) times a day as needed for diarrhea., Disp: , Rfl:  .  meclizine (ANTIVERT) 25 mg tablet, Take 1 tablet (25 mg total) by mouth 3 (three) times a day as needed for dizziness., Disp: 30 tablet, Rfl: 0 .  multivitamin (THERAGRAN) tab tablet, Take 1 tablet by mouth Once Daily., Disp: , Rfl:  .  omeprazole  (PriLOSEC) 20 mg DR capsule, Take 1 capsule (20 mg total) by mouth daily., Disp: 90 capsule, Rfl: 3 .  ondansetron (ZOFRAN) 8 mg tablet, TAKE 1 TABLET BY MOUTH EVERY 8 HOURS AS NEEDED FOR NAUSEA, Disp: 30 tablet, Rfl: 2 .  prochlorperazine (COMPAZINE) 10 mg tablet, TAKE 1 TABLET BY MOUTH EVERY 6 HOURS AS NEEDED FOR NAUSEA, Disp: 30 tablet, Rfl: 0 .  promethazine  (PHENERGAN ) 25 mg tablet, TAKE 1 TABLET BY MOUTH EVERY 6 HOURS AS NEEDED FOR NAUSEA, Disp: 30 tablet, Rfl: 5 .  traZODone (DESYREL) 100 mg tablet, Take 200 mg by mouth nightly., Disp: , Rfl:  Past medical history, allergies, current medications, social history and family history were reviewed and updated when appropriate.   Physical Examination: Vital Signs: Blood pressure 138/68, pulse 94, temperature 98 F (36.7 C), resp. rate 16, height 1.626 m (5' 4), weight 73.5 kg (162 lb), SpO2 95%. General: Tired appearing female in no acute distress HEENT: Normocephalic, atraumatic. PERRLA, EOMI, sclera anicteric.  Nose without discharge.  Mouth and lips showed  no lesions; oral mucosa is moist.  Neck supple without masses. Lymphatic/Immunologic:  No cervical, axillary , or femoral adenopathy.  Cardiovascular:  Regular rate and rhythm, no audible murmurs, gallops, or rubs.  No JVD noted on examination . Respiratory:  Chest clear to percussion and auscultation bilaterally; no respiratory distress . Patient speaks in complete sentences. Gastrointestinal:  Abdomen soft and without tenderness;  no hepatosplenomegaly or other masses. No clinical ascites. Musculoskeletal:  No bony pain or tenderness. No  tenosynovitis or joint effusions noted. Extremities:  No edema or suspicious rashes.  No cyanosis or clubbing. Skin:  No pathologic appearing petechiae or bruising noted. Neurologic: Alert and oriented to person, place, time and circumstance.   Strength and sensation are grossly intact.  Cranial nerves III through XII grossly intact.  Psychiatric:    Mood and affect are normal. Speech is fluent.  Results:  Results for orders placed or performed in visit on 12/16/23  Comprehensive Metabolic Panel   Collection Time: 12/16/23  2:08 PM  Result Value Ref Range   Sodium 135 (L) 136 - 145 mmol/L   Potassium 4.1 3.4 - 4.5 mmol/L   Chloride 101 98 - 107 mmol/L   CO2 27 21 - 31 mmol/L   Anion Gap 7 6 - 14 mmol/L   Glucose, Random 202 (H) 70 - 99 mg/dL   Blood Urea Nitrogen (BUN) 16 7 - 25 mg/dL   Creatinine 8.86 9.39 - 1.20 mg/dL   eGFR 51 (L) >40 fO/fpw/8.26f7   Albumin 4.3 3.5 - 5.7 g/dL   Total Protein 6.9 6.4 - 8.9 g/dL   Bilirubin, Total 0.6 0.3 - 1.0 mg/dL   Alkaline Phosphatase (ALP) 104 34 - 104 U/L   Aspartate Aminotransferase (AST) 18 13 - 39 U/L   Alanine Aminotransferase (ALT) 14 7 - 52 U/L   Calcium 9.1 8.6 - 10.3 mg/dL   BUN/Creatinine Ratio    CBC with Differential   Collection Time: 12/16/23  2:08 PM  Result Value Ref Range   WBC 5.22 4.40 - 11.00 10*3/uL   RBC 4.26 4.10 - 5.10 10*6/uL   Hemoglobin 13.0 12.3 - 15.3 g/dL   Hematocrit  61.7 64.0 - 44.6 %   Mean Corpuscular Volume (MCV) 89.6 80.0 - 96.0 fL   Mean Corpuscular Hemoglobin (MCH) 30.4 27.5 - 33.2 pg   Mean Corpuscular Hemoglobin Conc (MCHC) 34.0 33.0 - 37.0 g/dL   Red Cell Distribution Width (RDW) 16.7 12.3 - 17.0 %   Platelet Count (PLT) 193 150 - 450 10*3/uL   Mean Platelet Volume (MPV) 7.3 6.8 - 10.2 fL   Neutrophils % 61 %   Lymphocytes % 29 %   Monocytes % 4 %   Eosinophils % 4 %   Basophils % 2 %   Neutrophils Absolute 3.20 1.80 - 7.80 10*3/uL   Lymphocytes # 1.50 1.00 - 4.80 10*3/uL   Monocytes # 0.20 0.00 - 0.80 10*3/uL   Eosinophils # 0.20 0.00 - 0.50 10*3/uL   Basophils # 0.10 0.00 - 0.20 10*3/uL   *Note: Due to a large number of results and/or encounters for the requested time period, some results have not been displayed. A complete set of results can be found in Results Review.    Assessment and Plan:  Multiple myeloma - Clinically stable. S/p stem cell transplant at [facility] in March 2022. Patient  has been following up regularly with them. She is on Revlimid as maintenance therapy. Patient reports concerns with having severe side effects such as diarrhea and nausea from new prescription of generic Revlimid. Recommended she discusses with insurance to have her go back to brand name Revlimid.  Otherwise, her counts are adequate and she is tolerating it. We will continue monitoring her blood counts while she is on Revlimid.    RTC in 4 weeks. History of high-risk medication use. Reviewed potential long-term complications of her therapies.   Bone mets - on Zometa. Last received Zometa on 10/19/2023. She is due for Zometa  today, 12/16/2023.   Lower back pain - I am ordering an MRI for an evaluation.   Reviewed plan of care, answered all her questions, and reviewed her lab work with her.   In total, 30 minutes of time was spent on this encounter, greater than 50% of which was spent face-to-face with Annette  Brittle Wade for the  history, physical exam, assessment  and formulation of treatment plan.   This document serves as a record of services personally performed by Aida Lunger, MD. It was created on their behalf by Marlis Media, Scribe, a trained medical scribe. The creation of this record is the provider's dictation and/or activities during the visit.   Electronically signed by: Marlis Media, Scribe 12/16/2023 2:44 PM  I agree the documentation is accurate and complete.  Electronically signed by: Aida JONELLE Lunger, MD 12/22/2023 4:16 PM

## 2023-12-24 NOTE — Telephone Encounter (Signed)
 Annette Wade will be due for a refill of Revlimid on or about 8/27.  There are presently no refills remaining on the patient's current prescription. If appropriate, please proactively send a refill to the [facility].    Once the prescription is received insurance authorization and financial assistance will be confirmed.  The patient will be contacted and the medication arranged for delivery or pick-up in the patient's preferred manner. Please consider the time needed to dispense and deliver the medication in your response.  [facility] [phone]

## 2024-01-21 ENCOUNTER — Emergency Department (HOSPITAL_BASED_OUTPATIENT_CLINIC_OR_DEPARTMENT_OTHER)
Admission: EM | Admit: 2024-01-21 | Discharge: 2024-01-22 | Disposition: A | Discharge status: Home / Self Care | Attending: Emergency Medicine | Admitting: Emergency Medicine

## 2024-01-21 ENCOUNTER — Encounter (HOSPITAL_BASED_OUTPATIENT_CLINIC_OR_DEPARTMENT_OTHER): Payer: Self-pay | Admitting: Emergency Medicine

## 2024-01-21 ENCOUNTER — Other Ambulatory Visit: Payer: Self-pay

## 2024-01-21 DIAGNOSIS — R197 Diarrhea, unspecified: Secondary | ICD-10-CM | POA: Diagnosis present

## 2024-01-21 DIAGNOSIS — E860 Dehydration: Secondary | ICD-10-CM | POA: Diagnosis not present

## 2024-01-21 DIAGNOSIS — R110 Nausea: Secondary | ICD-10-CM

## 2024-01-21 DIAGNOSIS — I10 Essential (primary) hypertension: Secondary | ICD-10-CM | POA: Insufficient documentation

## 2024-01-21 DIAGNOSIS — J45909 Unspecified asthma, uncomplicated: Secondary | ICD-10-CM | POA: Insufficient documentation

## 2024-01-21 DIAGNOSIS — E039 Hypothyroidism, unspecified: Secondary | ICD-10-CM | POA: Diagnosis not present

## 2024-01-21 LAB — COMPREHENSIVE METABOLIC PANEL WITH GFR
ALT: 51 U/L — ABNORMAL HIGH (ref 0–44)
AST: 68 U/L — ABNORMAL HIGH (ref 15–41)
Albumin: 4.5 g/dL (ref 3.5–5.0)
Alkaline Phosphatase: 128 U/L — ABNORMAL HIGH (ref 38–126)
Anion gap: 19 — ABNORMAL HIGH (ref 5–15)
BUN: 19 mg/dL (ref 8–23)
CO2: 20 mmol/L — ABNORMAL LOW (ref 22–32)
Calcium: 8.9 mg/dL (ref 8.9–10.3)
Chloride: 99 mmol/L (ref 98–111)
Creatinine, Ser: 1.65 mg/dL — ABNORMAL HIGH (ref 0.44–1.00)
GFR, Estimated: 32 mL/min — ABNORMAL LOW (ref >60)
Glucose, Bld: 194 mg/dL — ABNORMAL HIGH (ref 70–99)
Potassium: 3.4 mmol/L — ABNORMAL LOW (ref 3.5–5.1)
Sodium: 137 mmol/L (ref 135–145)
Total Bilirubin: 1.1 mg/dL (ref 0.0–1.2)
Total Protein: 7.8 g/dL (ref 6.5–8.1)

## 2024-01-21 LAB — CBC
HCT: 40.4 % (ref 36.0–46.0)
Hemoglobin: 13.6 g/dL (ref 12.0–15.0)
MCH: 31 pg (ref 26.0–34.0)
MCHC: 33.7 g/dL (ref 30.0–36.0)
MCV: 92 fL (ref 80.0–100.0)
Platelets: 193 K/uL (ref 150–400)
RBC: 4.39 MIL/uL (ref 3.87–5.11)
RDW: 15 % (ref 11.5–15.5)
WBC: 7 K/uL (ref 4.0–10.5)
nRBC: 0 % (ref 0.0–0.2)

## 2024-01-21 LAB — LIPASE, BLOOD: Lipase: 18 U/L (ref 11–51)

## 2024-01-21 MED ORDER — PROMETHAZINE HCL 25 MG PO TABS
25.0000 mg | ORAL_TABLET | Freq: Once | ORAL | Status: AC
  Administered 2024-01-21: 25 mg via ORAL
  Filled 2024-01-21: qty 1

## 2024-01-21 MED ORDER — SODIUM CHLORIDE 0.9 % IV BOLUS
1000.0000 mL | Freq: Once | INTRAVENOUS | Status: AC
Start: 2024-01-21 — End: 2024-01-22
  Administered 2024-01-21: 1000 mL via INTRAVENOUS

## 2024-01-21 NOTE — ED Triage Notes (Signed)
 Patient here w/ complaints of shakiness, nausea, diarrhea,dehydration, lethargy for the last week now that's progressively gotten worse. She takes Revlimid post bone marrow transplant 3 years ago however has felt worse this past week. Poor PO intake. Denies chest pain, SHOB, abdominal pain. Aox4.

## 2024-01-21 NOTE — ED Provider Notes (Signed)
 Emergency Department Provider Note   I have reviewed the triage vital signs and the nursing notes.   HISTORY  Chief Complaint Nausea, Diarrhea, and Dehydration   HPI Annette  Wade is a 73 y.o. female past history of hypertension status post bone marrow transplant in 2022 at [facility] and followed by [facility] for multiple myeloma presents to the emergency department with fatigue, continued diarrhea, shaking.  Patient has had all the symptoms for the past week or so getting worse in the past 2 days.  Symptoms are thought to be caused by generic Revlimid.  She is eating and drinking without vomiting.  She continues to have diarrhea which is nonbloody.  No fevers.  Denies chest or abdominal pain.  Her son-in-law came to visit this evening and felt like she looked more dehydrated than normal and decided to bring her to the ED.  Patient notes some nausea and notes that she takes Phenergan  at home with relief and she would like some of that here.  Had a migraine headache earlier but that is resolved.   Past Medical History:  Diagnosis Date   Allergic rhinitis    Asthma    Hypertension    Hypothyroid     Review of Systems  Constitutional: No fever/chills. Positive fatigue and shaking.  Cardiovascular: Denies chest pain. Respiratory: Denies shortness of breath. Gastrointestinal: No abdominal pain.  No nausea, no vomiting. Positive diarrhea.  Genitourinary: Negative for dysuria. Musculoskeletal: Negative for back pain. Skin: Negative for rash. Neurological: Negative for headaches.  ____________________________________________   PHYSICAL EXAM:  VITAL SIGNS: ED Triage Vitals  Encounter Vitals Group     BP 01/21/24 2308 (!) 149/94     Pulse Rate 01/21/24 2308 (!) 104     Resp 01/21/24 2308 20     Temp 01/21/24 2308 97.6 F (36.4 C)     Temp src --      SpO2 01/21/24 2308 100 %     Weight 01/21/24 2310 160 lb 15 oz (73 kg)     Height 01/21/24 2310 5' 4 (1.626 m)    Constitutional: Alert and oriented. Well appearing and in no acute distress.  Patient with generalized tremor.  Eyes: Conjunctivae are normal.  Head: Atraumatic. Nose: No congestion/rhinnorhea. Mouth/Throat: Mucous membranes are slightly dry.  Neck: No stridor.   Cardiovascular: Tachycardia. Good peripheral circulation. Grossly normal heart sounds.   Respiratory: Normal respiratory effort.  No retractions. Lungs CTAB. Gastrointestinal: Soft and nontender. No distention.  Musculoskeletal: No lower extremity tenderness nor edema. No gross deformities of extremities. Neurologic:  Normal speech and language. Skin:  Skin is warm, dry and intact. No rash noted.   ____________________________________________   LABS (all labs ordered are listed, but only abnormal results are displayed)  Labs Reviewed  COMPREHENSIVE METABOLIC PANEL WITH GFR - Abnormal; Notable for the following components:      Result Value   Potassium 3.4 (*)    CO2 20 (*)    Glucose, Bld 194 (*)    Creatinine, Ser 1.65 (*)    AST 68 (*)    ALT 51 (*)    Alkaline Phosphatase 128 (*)    GFR, Estimated 32 (*)    Anion gap 19 (*)    All other components within normal limits  BASIC METABOLIC PANEL WITH GFR - Abnormal; Notable for the following components:   Glucose, Bld 175 (*)    Creatinine, Ser 1.37 (*)    Calcium 8.0 (*)    GFR, Estimated 40 (*)  All other components within normal limits  LIPASE, BLOOD  CBC  URINALYSIS, ROUTINE W REFLEX MICROSCOPIC    ____________________________________________   PROCEDURES  Procedure(s) performed:   Procedures  None  ____________________________________________   INITIAL IMPRESSION / ASSESSMENT AND PLAN / ED COURSE  Pertinent labs & imaging results that were available during my care of the patient were reviewed by me and considered in my medical decision making (see chart for details).   This patient is Presenting for Evaluation of dehydration, which does  require a range of treatment options, and is a complaint that involves a high risk of morbidity and mortality.  The Differential Diagnoses include medication side effect, viral enteritis, colitis, AKI, etc.  Critical Interventions-    Medications  sodium chloride  0.9 % bolus 1,000 mL (0 mLs Intravenous Stopped 01/22/24 0116)  promethazine  (PHENERGAN ) tablet 25 mg (25 mg Oral Given 01/21/24 2349)    Reassessment after intervention:  symptoms improved.    I did obtain Additional Historical Information from son in law at bedside.   Clinical Laboratory Tests Ordered, included   Cardiac Monitor Tracing which shows NSR.    Social Determinants of Health Risk patient is a non-smoker.   Medical Decision Making: Summary: Patient presents to the emergency department with concern for dehydration.  She has had side effects from the generic medication which have worsened in the past 2 days.  Abdomen diffusely soft and nontender.  Mild tachycardia on arrival without fever or other infection symptoms.  Doubt sepsis.  Plan for IV fluids, p.o. Phenergan , and screening blood work.  No subjective abdominal pain or point tenderness on exam.  Defer abdominal imaging for now.  Reevaluation with update and discussion with patient.  She is feeling much better after IV fluids.  Repeat BMP shows resolution of gap acidosis and improvement in kidney function. Advised close PCP follow up and PO hydration at home.   Considered admission but appears stable for discharge.   Patient's presentation is most consistent with acute presentation with potential threat to life or bodily function.   Disposition: discharge  ____________________________________________  FINAL CLINICAL IMPRESSION(S) / ED DIAGNOSES  Final diagnoses:  Dehydration  Nausea    Note:  This document was prepared using Dragon voice recognition software and may include unintentional dictation errors.  Fonda Law, MD, [facility] Emergency  Medicine    Tomer Chalmers, Fonda MATSU, MD 01/22/24 [phone]

## 2024-01-22 LAB — BASIC METABOLIC PANEL WITH GFR
Anion gap: 13 (ref 5–15)
BUN: 17 mg/dL (ref 8–23)
CO2: 22 mmol/L (ref 22–32)
Calcium: 8 mg/dL — ABNORMAL LOW (ref 8.9–10.3)
Chloride: 104 mmol/L (ref 98–111)
Creatinine, Ser: 1.37 mg/dL — ABNORMAL HIGH (ref 0.44–1.00)
GFR, Estimated: 40 mL/min — ABNORMAL LOW (ref >60)
Glucose, Bld: 175 mg/dL — ABNORMAL HIGH (ref 70–99)
Potassium: 3.5 mmol/L (ref 3.5–5.1)
Sodium: 138 mmol/L (ref 135–145)

## 2024-01-22 NOTE — ED Notes (Signed)
 Pt unable to leave urine sample at this time.

## 2024-01-22 NOTE — Discharge Instructions (Signed)
 Your lab work is looking much better after IV fluids.  Please continue to do your best to stay hydrated at home.  Please call your primary care doctor on Monday for close follow-up appointment, ideally in the coming week.

## 2024-03-22 ENCOUNTER — Ambulatory Visit: Admitting: Internal Medicine

## 2024-04-10 ENCOUNTER — Telehealth: Payer: Self-pay | Admitting: Family Medicine

## 2024-04-10 ENCOUNTER — Other Ambulatory Visit: Payer: Self-pay | Admitting: Internal Medicine

## 2024-04-10 MED ORDER — SULFAMETHOXAZOLE-TRIMETHOPRIM 800-160 MG PO TABS: 1.0000 | ORAL_TABLET | Freq: Two times a day (BID) | ORAL | 0 refills | Status: AC

## 2024-04-10 NOTE — Telephone Encounter (Signed)
 Please let her know I called in Bactrim  for her to take 10 days. If she gets worse or develops new or worsening symptoms, doesn't get better in the next 2-3 days, will need in person visit. Thanks [employer].

## 2024-04-10 NOTE — Telephone Encounter (Signed)
 PT called to get Rx for sinus infection, reviewed with Dr Marinda - will send in limited Rx due to PT having broken femur and possible stomach virus, but need to follow up with video visit or urgent care trip.  Reviewed billing w/[employer], advised could do a MyChart Video visit, but has to have video component, can not be telephone only.  Called PT back, scheduled for Wednesday @ 3:00

## 2024-04-10 NOTE — Progress Notes (Signed)
 Patient called today reporting symptoms concerning for sinus infeciton, no fever, but congestion and pressure with drainage. She is unable to physically come in due to broken femur.  I have agreed to call in antibiotics but have given strict precautions to seek in person visit if symptoms do not improve in the next 2-3 days, develop new or worsening symptoms.

## 2024-04-12 ENCOUNTER — Telehealth: Payer: Self-pay

## 2024-04-12 ENCOUNTER — Telehealth: Admitting: Internal Medicine

## 2024-04-12 NOTE — Telephone Encounter (Signed)
 Patient called back. She states her headache is improved. She is feeling overall better. She uses [facility] on [address] but does not need any refills at this time. The rehab facility all got the stomach virus and it spread like a wild fire She is over the it now. She appreciates us  checking on her. She will call if anything changes.

## 2024-04-12 NOTE — Telephone Encounter (Signed)
 Called and left message for patent to call us  in [place]/ Dr Marinda for medical update
